# Patient Record
Sex: Male | Born: 1985
Health system: Southern US, Community
[De-identification: ages and names within clinical notes are randomized; demographics above are authoritative.]

## PROBLEM LIST (undated history)

## (undated) DIAGNOSIS — E119 Type 2 diabetes mellitus without complications: Secondary | ICD-10-CM

## (undated) HISTORY — DX: Type 2 diabetes mellitus without complications: E11.9

## (undated) HISTORY — PX: NOSE SURGERY: SHX723

---

## 2010-04-05 ENCOUNTER — Ambulatory Visit: Payer: Self-pay | Admitting: Internal Medicine

## 2010-04-06 ENCOUNTER — Ambulatory Visit: Payer: Self-pay | Admitting: Family Medicine

## 2012-06-13 ENCOUNTER — Ambulatory Visit: Payer: Self-pay

## 2013-08-25 ENCOUNTER — Encounter (HOSPITAL_COMMUNITY): Payer: Self-pay | Admitting: Emergency Medicine

## 2013-08-25 ENCOUNTER — Emergency Department (INDEPENDENT_AMBULATORY_CARE_PROVIDER_SITE_OTHER)
Admission: EM | Admit: 2013-08-25 | Discharge: 2013-08-25 | Disposition: A | Discharge status: Home / Self Care | Payer: Worker's Compensation | Source: Home / Self Care | Attending: Family Medicine | Admitting: Family Medicine

## 2013-08-25 DIAGNOSIS — S61209A Unspecified open wound of unspecified finger without damage to nail, initial encounter: Secondary | ICD-10-CM

## 2013-08-25 DIAGNOSIS — S61219A Laceration without foreign body of unspecified finger without damage to nail, initial encounter: Secondary | ICD-10-CM

## 2013-08-25 MED ORDER — CEPHALEXIN 500 MG PO CAPS: 500.0000 mg | ORAL_CAPSULE | Freq: Four times a day (QID) | ORAL | Status: DC

## 2013-08-25 NOTE — ED Notes (Signed)
Tetanus 2 years ago 

## 2013-08-25 NOTE — ED Notes (Signed)
Cut finger washing dishes, laceration across right index finger, knuckle .  Bleeding controlled currently.

## 2013-08-25 NOTE — ED Notes (Signed)
Soaking finger.

## 2013-08-25 NOTE — Discharge Instructions (Signed)
Thank you for coming in today.' Keep the wound clean and covered with ointment Get the stitches taken out in one week Take Keflex 4 times daily for one week

## 2013-08-25 NOTE — ED Provider Notes (Signed)
Trevor Dalton is a 28 y.o. male who presents to Urgent Care today for right finger laceration. This occurred this evening at work. Patient was cleaning a knife. He denies any significant numbness or weakness. He has no problems with extension. His last tetanus was a few years ago. He feels well. His only medical problem is type 1 diabetes controlled with insulin.    Past Medical History  Diagnosis Date  . Diabetes mellitus without complication    History  Substance Use Topics  . Smoking status: Never Smoker   . Smokeless tobacco: Not on file  . Alcohol Use: Yes   ROS as above Medications: No current facility-administered medications for this encounter.   Current Outpatient Prescriptions  Medication Sig Dispense Refill  . insulin aspart (NOVOLOG) 100 UNIT/ML injection Inject into the skin 3 (three) times daily before meals.      . insulin glargine (LANTUS) 100 UNIT/ML injection Inject into the skin at bedtime.      . cephALEXin (KEFLEX) 500 MG capsule Take 1 capsule (500 mg total) by mouth 4 (four) times daily.  28 capsule  0    Exam:  BP 136/97  Pulse 92  Temp(Src) 97.8 F (36.6 C) (Oral)  Resp 20  SpO2 98% Gen: Well NAD SKIN: 1 cm laceration along the radial side of the right index finger just distal to the PIP. No tendon seen with range of motion. Sensation is very slightly decreased on the distal radial end of the finger. Pulses and capillary refill are intact. Strength is intact to flexion and extension.   Laceration repair:  Consent obtained and timeout performed Skin cleaned with Betadine. 4 mL of lidocaine was injected to achieve a good digital block. The wound was copiously irrigated with sterile saline The skin was prepped and draped in the usual sterile fashion One horizontal mattress suture using 4-0 Prolene was used to close the wound Wound well with range of motion. The Betadine was cleaned off of the skin and a sterile dressing was applied.   Assessment  and Plan: 28 y.o. male with right finger laceration. Will place patient on apparent Keflex antibiotics as he is relatively immunocompromised due to his diabetes. Tetanus is up-to-date. Followup in one week for suture removal.  Discussed warning signs or symptoms. Please see discharge instructions. Patient expresses understanding.    Trevor BongEvan S Mechel Schutter, MD 08/25/13 2003

## 2013-09-10 ENCOUNTER — Encounter: Payer: Self-pay | Attending: Endocrinology | Admitting: Nutrition

## 2013-09-10 ENCOUNTER — Encounter: Payer: Self-pay | Admitting: Endocrinology

## 2013-09-10 ENCOUNTER — Ambulatory Visit (INDEPENDENT_AMBULATORY_CARE_PROVIDER_SITE_OTHER): Payer: Self-pay | Admitting: Endocrinology

## 2013-09-10 VITALS — BP 132/90 | HR 93 | Temp 98.5°F | Ht 73.0 in | Wt 213.0 lb

## 2013-09-10 DIAGNOSIS — E109 Type 1 diabetes mellitus without complications: Secondary | ICD-10-CM | POA: Insufficient documentation

## 2013-09-10 MED ORDER — INSULIN GLARGINE 100 UNIT/ML SOLOSTAR PEN: 25.0000 [IU] | PEN_INJECTOR | Freq: Two times a day (BID) | SUBCUTANEOUS | Status: DC

## 2013-09-10 MED ORDER — INSULIN ASPART 100 UNIT/ML FLEXPEN: PEN_INJECTOR | SUBCUTANEOUS | Status: DC

## 2013-09-10 NOTE — Progress Notes (Signed)
   Subjective:    Patient ID: Trevor Dalton, Trevor Dalton    DOB: 12/15/1985, 28 y.o.   MRN: [id number]  HPI pt states DM was dx'ed in 1992; he has mild if any neuropathy of the lower extremities; he is unaware of any associated chronic complications.  he has been on insulin since dx.  He was on pump rx from 2006-2011.  pt says his diet and exercise are good.  He ran out of lantus 1 month ago.  Since then, cbg's have been elevated.  He has never had DKA; he has had only 1 episode of severe hypoglycemia, in 2005.  Pt says his insurance changes are compromising his ability to get insulin. Past Medical History  Diagnosis Date  . Diabetes mellitus without complication     No past surgical history on file.  History   Social History  . Marital Status: Single    Spouse Name: N/A    Number of Children: N/A  . Years of Education: N/A   Occupational History  . Not on file.   Social History Main Topics  . Smoking status: Never Smoker   . Smokeless tobacco: Not on file  . Alcohol Use: Yes  . Drug Use: Yes    Special: Marijuana  . Sexual Activity: Not on file   Other Topics Concern  . Not on file   Social History Narrative  . No narrative on file   No current outpatient prescriptions on file prior to visit.   No current facility-administered medications on file prior to visit.   No Known Allergies  No family history on file.  BP 132/90  Pulse 93  Temp(Src) 98.5 F (36.9 C) (Oral)  Ht 6\' 1"  (1.854 m)  Wt 213 lb (96.616 kg)  BMI 28.11 kg/m2  SpO2 97%  Review of Systems denies weight loss, blurry vision, headache, chest pain, sob, n/v, cramps, excessive diaphoresis, memory loss, depression, cold intolerance, rhinorrhea, and easy bruising. He has urinary frequency.    Objective:   Physical Exam VS: see vs page GEN: no distress HEAD: head: no deformity eyes: no periorbital swelling, no proptosis external nose and ears are normal mouth: no lesion seen NECK: supple, thyroid is  not enlarged CHEST WALL: no deformity LUNGS: clear to auscultation BREASTS:  No gynecomastia CV: reg rate and rhythm, no murmur ABD: abdomen is soft, nontender.  no hepatosplenomegaly.  not distended.  no hernia MUSCULOSKELETAL: muscle bulk and strength are grossly normal.  no obvious joint swelling.  gait is normal and steady PULSES:  no carotid bruit NEURO:  cn 2-12 grossly intact.   readily moves all 4's.   SKIN:  Normal texture and temperature.  No rash or suspicious lesion is visible. Insulin injection sites at the anterior abdomen have hypertrophic sq fat. NODES:  None palpable at the neck PSYCH: alert, well-oriented.  Does not appear anxious nor depressed.   outside test results are reviewed: A1c=10.2    Assessment & Plan:  Type 1 DM: poor control Hypertrophy of sq fat, due to insulin injections.  Pt is advised to avoid these areas. Economic circumstances: these compromise the care of his DM.

## 2013-09-10 NOTE — Patient Instructions (Addendum)
good diet and exercise habits significanly improve the control of your diabetes.  please let me know if you wish to be referred to a dietician.  high blood sugar is very risky to your health.  you should see an eye doctor every year.  You are at higher than average risk for pneumonia and hepatitis-B.  You should be vaccinated against both.   controlling your blood pressure and cholesterol drastically reduces the damage diabetes does to your body.  this also applies to quitting smoking.  please discuss these with your doctor.  check your blood sugar 4 times a day: before the 3 meals, and at bedtime.  also check if you have symptoms of your blood sugar being too high or too low.  please keep a record of the readings and bring it to your next appointment here.  You can write it on any piece of paper.  please call us sooner if your blood sugar goes below 70, or if you have a lot of readings over 200.   Please continue the same lantus, and: Novolog: 1 unit for each 15 grams carbohydate, and 1 unit for each 50 over 150.   Please come back for a follow-up appointment in 3 months.  Please see Cristy FolksLinda Spagnola, about resuming pump therapy.

## 2013-12-11 ENCOUNTER — Ambulatory Visit: Payer: Self-pay | Admitting: Endocrinology

## 2013-12-11 DIAGNOSIS — Z0289 Encounter for other administrative examinations: Secondary | ICD-10-CM

## 2014-10-16 ENCOUNTER — Other Ambulatory Visit: Payer: Self-pay | Admitting: Endocrinology

## 2014-10-16 NOTE — Telephone Encounter (Signed)
Please advise if ok to refill rx. Pt was last seen in March of 2015.  Thanks!

## 2014-10-16 NOTE — Telephone Encounter (Signed)
Rx sent per pt's request.  

## 2014-10-16 NOTE — Telephone Encounter (Signed)
Please refill x 1 Ov is due  

## 2014-12-04 ENCOUNTER — Encounter: Payer: Self-pay | Admitting: Endocrinology

## 2014-12-04 ENCOUNTER — Ambulatory Visit: Payer: 59 | Admitting: Endocrinology

## 2014-12-04 ENCOUNTER — Telehealth: Payer: Self-pay

## 2014-12-04 VITALS — BP 132/80 | HR 100 | Temp 98.2°F | Ht 73.0 in | Wt 226.0 lb

## 2014-12-04 DIAGNOSIS — E109 Type 1 diabetes mellitus without complications: Secondary | ICD-10-CM

## 2014-12-04 LAB — LIPID PANEL
CHOL/HDL RATIO: 3
Cholesterol: 212 mg/dL — ABNORMAL HIGH (ref 0–200)
HDL: 65.6 mg/dL (ref >39.00)
LDL Cholesterol: 121 mg/dL — ABNORMAL HIGH (ref 0–99)
NONHDL: 146.4
Triglycerides: 126 mg/dL (ref 0.0–149.0)
VLDL: 25.2 mg/dL (ref 0.0–40.0)

## 2014-12-04 LAB — BASIC METABOLIC PANEL
BUN: 11 mg/dL (ref 6–23)
CALCIUM: 8.9 mg/dL (ref 8.4–10.5)
CHLORIDE: 99 meq/L (ref 96–112)
CO2: 25 mEq/L (ref 19–32)
Creatinine, Ser: 0.81 mg/dL (ref 0.40–1.50)
GFR: 121.43 mL/min (ref >60.00)
GLUCOSE: 297 mg/dL — AB (ref 70–99)
Potassium: 4.4 mEq/L (ref 3.5–5.1)
SODIUM: 132 meq/L — AB (ref 135–145)

## 2014-12-04 LAB — HEMOGLOBIN A1C: Hgb A1c MFr Bld: 9 % — ABNORMAL HIGH (ref 4.6–6.5)

## 2014-12-04 LAB — TSH: TSH: 0.97 u[IU]/mL (ref 0.35–4.50)

## 2014-12-04 MED ORDER — INSULIN GLARGINE 100 UNIT/ML SOLOSTAR PEN: PEN_INJECTOR | SUBCUTANEOUS | Status: DC

## 2014-12-04 MED ORDER — INSULIN PEN NEEDLE 31G X 5 MM MISC: Status: DC

## 2014-12-04 MED ORDER — INSULIN ASPART 100 UNIT/ML FLEXPEN: PEN_INJECTOR | SUBCUTANEOUS | Status: DC

## 2014-12-04 MED ORDER — INSULIN LISPRO 100 UNIT/ML (KWIKPEN)
60.0000 [IU] | PEN_INJECTOR | Freq: Every day | SUBCUTANEOUS | Status: DC
Start: 2014-12-04 — End: 2015-07-27

## 2014-12-04 MED ORDER — GLUCOSE BLOOD VI STRP: 1.0000 | ORAL_STRIP | Freq: Four times a day (QID) | Status: DC

## 2014-12-04 NOTE — Patient Instructions (Addendum)
check your blood sugar 4 times a day: before the 3 meals, and at bedtime.  also check if you have symptoms of your blood sugar being too high or too low.  please keep a record of the readings and bring it to your next appointment here.  You can write it on any piece of paper.  please call us sooner if your blood sugar goes below 70, or if you have a lot of readings over 200.   blood tests are requested for you today.  We'll let you know about the results. Please continue the same lantus (i have sent a prescription to your pharmacy), and: Novolog: 1 unit for each 15 grams carbohydate, and 1 unit for each 50 over 150.   Please come back for a follow-up appointment in 3 months.  Please let me know if you want to get another pump.

## 2014-12-04 NOTE — Telephone Encounter (Signed)
[facility] stating Novolog is not covered under his insurance. Humalog is the covered alternative. Please advise if ok to change. Thanks!

## 2014-12-04 NOTE — Progress Notes (Signed)
Subjective:    Patient ID: Trevor Dalton, male    DOB: 10/27/1985, 29 y.o.   MRN: [id number]  HPI  Pt returns for f/u of diabetes mellitus: DM type: Insulin-requiring type 2 Dx'ed: 1992 Complications: none Therapy: insulin since dx DKA: never Severe hypoglycemia: once, in 2005 Pancreatitis: never Other: he needs a simple insulin regimen, due to h/o noncompliance Interval history: He has regained his health insurance.  He has been out of lantus, but he takes extra novolog.  When he is on the novolog, he averages 20-40 units total per day.  pt states he feels well in general. Past Medical History  Diagnosis Date  . Diabetes mellitus without complication     No past surgical history on file.  History   Social History  . Marital Status: Single    Spouse Name: N/A  . Number of Children: N/A  . Years of Education: N/A   Occupational History  . Not on file.   Social History Main Topics  . Smoking status: Never Smoker   . Smokeless tobacco: Not on file  . Alcohol Use: Yes  . Drug Use: Yes    Special: Marijuana  . Sexual Activity: Not on file   Other Topics Concern  . Not on file   Social History Narrative    No current outpatient prescriptions on file prior to visit.   No current facility-administered medications on file prior to visit.    No Known Allergies  No family history on file.  BP 132/80 mmHg  Pulse 100  Temp(Src) 98.2 F (36.8 C) (Oral)  Ht 6\' 1"  (1.854 m)  Wt 226 lb (102.513 kg)  BMI 29.82 kg/m2  SpO2 92%   Review of Systems He denies hypoglycemia.     Objective:   Physical Exam VITAL SIGNS:  See vs page GENERAL: no distress Pulses: dorsalis pedis intact bilat.   MSK: no deformity of the feet.   CV: no leg edema. Skin:  no ulcer on the feet.  normal color and temp on the feet. Neuro: sensation is intact to touch on the feet.  Lab Results  Component Value Date   GLUCOSE 297* 12/04/2014   CHOL 212* 12/04/2014   TRIG 126.0  12/04/2014   HDL 65.60 12/04/2014   LDLCALC 121* 12/04/2014   NA 132* 12/04/2014   K 4.4 12/04/2014   CL 99 12/04/2014   CREATININE 0.81 12/04/2014   BUN 11 12/04/2014   CO2 25 12/04/2014   TSH 0.97 12/04/2014   HGBA1C 9.0* 12/04/2014       Assessment & Plan:  DM: he needs increased rx Dyslipidemia: new: i advised medication  Patient is advised the following: Patient Instructions  check your blood sugar 4 times a day: before the 3 meals, and at bedtime.  also check if you have symptoms of your blood sugar being too high or too low.  please keep a record of the readings and bring it to your next appointment here.  You can write it on any piece of paper.  please call us sooner if your blood sugar goes below 70, or if you have a lot of readings over 200.   blood tests are requested for you today.  We'll let you know about the results. Please continue the same lantus (i have sent a prescription to your pharmacy), and: Novolog: 1 unit for each 15 grams carbohydate, and 1 unit for each 50 over 150.   Please come back for a follow-up appointment in  3 months.  Please let me know if you want to get another pump.

## 2014-12-04 NOTE — Telephone Encounter (Signed)
ok 

## 2014-12-04 NOTE — Telephone Encounter (Signed)
Rx sent to pt's pharmacy. Patient was not available to notify.

## 2014-12-07 ENCOUNTER — Encounter: Payer: Self-pay | Admitting: Endocrinology

## 2014-12-07 LAB — MICROALBUMIN / CREATININE URINE RATIO
CREATININE, U: 143.4 mg/dL
Microalb Creat Ratio: 0.7 mg/g (ref 0.0–30.0)
Microalb, Ur: 1 mg/dL (ref 0.0–1.9)

## 2015-03-09 ENCOUNTER — Ambulatory Visit: Payer: Self-pay | Admitting: Endocrinology

## 2015-07-27 ENCOUNTER — Telehealth: Payer: Self-pay

## 2015-07-27 MED ORDER — INSULIN ASPART 100 UNIT/ML FLEXPEN: 60.0000 [IU] | PEN_INJECTOR | Freq: Every day | SUBCUTANEOUS | Status: DC

## 2015-07-27 NOTE — Telephone Encounter (Signed)
Received notice from the pt's pharmacy stating Humalog is no longer covered under the pt's formulary. Preferred alternative is novolog. Can we switch the medication?

## 2015-07-27 NOTE — Telephone Encounter (Signed)
Ok. Please refill x 1 Ov is due  

## 2015-07-27 NOTE — Telephone Encounter (Signed)
Rx sent. Attempted to reach the pt. Pt unavailable, appointment letter mailed to the pt.

## 2016-02-24 ENCOUNTER — Encounter: Payer: Self-pay | Admitting: Endocrinology

## 2016-02-24 ENCOUNTER — Other Ambulatory Visit: Payer: Self-pay | Admitting: Endocrinology

## 2016-02-24 MED ORDER — GLUCOSE BLOOD VI STRP: 1.0000 | ORAL_STRIP | Freq: Four times a day (QID) | 0 refills | Status: DC

## 2016-02-24 MED ORDER — INSULIN ASPART 100 UNIT/ML FLEXPEN: 60.0000 [IU] | PEN_INJECTOR | Freq: Every day | SUBCUTANEOUS | 0 refills | Status: DC

## 2016-02-24 MED ORDER — INSULIN GLARGINE 100 UNIT/ML SOLOSTAR PEN: PEN_INJECTOR | SUBCUTANEOUS | 0 refills | Status: DC

## 2016-05-10 ENCOUNTER — Other Ambulatory Visit: Payer: Self-pay | Admitting: Endocrinology

## 2016-05-10 MED ORDER — INSULIN GLARGINE 100 UNIT/ML SOLOSTAR PEN: PEN_INJECTOR | SUBCUTANEOUS | 0 refills | Status: DC

## 2016-05-10 MED ORDER — INSULIN ASPART 100 UNIT/ML FLEXPEN: 60.0000 [IU] | PEN_INJECTOR | Freq: Every day | SUBCUTANEOUS | 0 refills | Status: DC

## 2016-05-10 NOTE — Telephone Encounter (Signed)
Please refill x 1 Ov is due  

## 2016-05-10 NOTE — Telephone Encounter (Signed)
Pt called and requests refill of his insulin and pen needles to be sent to [facility].  He stated the pen needles can be the smallest size. Appt scheduled for Monday

## 2016-05-14 NOTE — Progress Notes (Signed)
Subjective:    Patient ID: Trevor Dalton, male    DOB: 04/04/1986, 30 y.o.   MRN: [id number]  HPI Pt returns for f/u of diabetes mellitus: DM type: Insulin-requiring type 2 Dx'ed: 1992 Complications: none Therapy: insulin since dx DKA: never Severe hypoglycemia: once, in 2005.   Pancreatitis: never.   Other: he takes multiple daily injections Interval history: He has regained his health insurance once again.  He is back on both insulins.  He averages a total of 30-50 total units of novolog, per day.  pt states he feels well in general.  He seldom misses the lantus, but does not miss the novolog.  He has mild hypoglycemia approx twice a week, and these episodes are mild.  no cbg record, but states cbg's vary from 90-200.  There is no trend throughout the day.  He declines pump, but he wants a continuous glucose monitor.   Past Medical History:  Diagnosis Date  . Diabetes mellitus without complication (HCC)     No past surgical history on file.  Social History   Social History  . Marital status: Single    Spouse name: N/A  . Number of children: N/A  . Years of education: N/A   Occupational History  . Not on file.   Social History Main Topics  . Smoking status: Never Smoker  . Smokeless tobacco: Not on file  . Alcohol use Yes  . Drug use:     Types: Marijuana  . Sexual activity: Not on file   Other Topics Concern  . Not on file   Social History Narrative  . No narrative on file    Current Outpatient Prescriptions on File Prior to Visit  Medication Sig Dispense Refill  . glucose blood (ONETOUCH VERIO) test strip 1 each by Other route 4 (four) times daily. And lancets 4/day 120 each 0  . insulin aspart (NOVOLOG FLEXPEN) 100 UNIT/ML FlexPen Inject 60 Units into the skin daily. (Patient taking differently: Inject 60 Units into the skin daily. 1 unit to every 15 grams of carbs) 30 mL 0  . Insulin Glargine (LANTUS SOLOSTAR) 100 UNIT/ML Solostar Pen Inject 25 units into  the skin two times daily. (Patient taking differently: Inject 30 units into the skin two times daily.) 15 mL 0   No current facility-administered medications on file prior to visit.     No Known Allergies  No family history on file.  BP 138/86   Pulse 82   Ht 6\' 1"  (1.854 m)   Wt 218 lb (98.9 kg)   SpO2 98%   BMI 28.76 kg/m    Review of Systems He denies hypoglycemia.      Objective:   Physical Exam VITAL SIGNS:  See vs page GENERAL: no distress Pulses: dorsalis pedis intact bilat.   MSK: no deformity of the feet CV: no leg edema Skin:  no ulcer on the feet.  normal color and temp on the feet. Neuro: sensation is intact to touch on the feet.    A1c=8.2%.     Assessment & Plan:  Type 1 DM: he needs increased rx.  The next step is continuous glucose monitor.    Patient is advised the following: Patient Instructions  check your blood sugar 4 times a day: before the 3 meals, and at bedtime.  also check if you have symptoms of your blood sugar being too high or too low.  please keep a record of the readings and bring it to your next  appointment here.  You can write it on any piece of paper.  please call us sooner if your blood sugar goes below 70, or if you have a lot of readings over 200.   Please continue the same lantus (I have sent a prescription to your pharmacy), and:  Novolog: 1 unit for each 15 grams carbohydate, and 1 unit for each 50 by which your blood sugar exceeds 150.   Please see Bonita QuinLinda, about a continuous glucose monitor.   Please come back for a follow-up appointment in 2 months.

## 2016-05-15 ENCOUNTER — Encounter: Payer: Self-pay | Admitting: Endocrinology

## 2016-05-15 ENCOUNTER — Ambulatory Visit (INDEPENDENT_AMBULATORY_CARE_PROVIDER_SITE_OTHER): Payer: BLUE CROSS/BLUE SHIELD | Admitting: Endocrinology

## 2016-05-15 VITALS — BP 138/86 | HR 82 | Ht 73.0 in | Wt 218.0 lb

## 2016-05-15 DIAGNOSIS — Z23 Encounter for immunization: Secondary | ICD-10-CM

## 2016-05-15 DIAGNOSIS — E109 Type 1 diabetes mellitus without complications: Secondary | ICD-10-CM | POA: Diagnosis not present

## 2016-05-15 LAB — POCT GLYCOSYLATED HEMOGLOBIN (HGB A1C): Hemoglobin A1C: 8.2

## 2016-05-15 LAB — MICROALBUMIN / CREATININE URINE RATIO
Creatinine,U: 98.9 mg/dL
Microalb Creat Ratio: 0.7 mg/g (ref 0.0–30.0)
Microalb, Ur: <0.7 mg/dL (ref 0.0–1.9)

## 2016-05-15 MED ORDER — INSULIN PEN NEEDLE 31G X 5 MM MISC: 2 refills | Status: DC

## 2016-05-15 NOTE — Patient Instructions (Addendum)
check your blood sugar 4 times a day: before the 3 meals, and at bedtime.  also check if you have symptoms of your blood sugar being too high or too low.  please keep a record of the readings and bring it to your next appointment here.  You can write it on any piece of paper.  please call us sooner if your blood sugar goes below 70, or if you have a lot of readings over 200.   Please continue the same lantus (I have sent a prescription to your pharmacy), and:  Novolog: 1 unit for each 15 grams carbohydate, and 1 unit for each 50 by which your blood sugar exceeds 150.   Please see Bonita QuinLinda, about a continuous glucose monitor.   Please come back for a follow-up appointment in 2 months.

## 2016-05-21 ENCOUNTER — Other Ambulatory Visit: Payer: Self-pay | Admitting: Endocrinology

## 2016-07-09 NOTE — Progress Notes (Deleted)
   Subjective:    Patient ID: Trevor Dalton, male    DOB: 03/19/1986, 31 y.o.   MRN: [id number]  HPI Pt returns for f/u of diabetes mellitus: DM type: Insulin-requiring type 2 Dx'ed: 1992 Complications: none Therapy: insulin since dx DKA: never Severe hypoglycemia: once, in 2005.   Pancreatitis: never.   Other: he takes multiple daily injections Interval history: He has regained his health insurance once again.  He is back on both insulins.  He averages a total of 30-50 total units of novolog, per day.  pt states he feels well in general.  He seldom misses the lantus, but does not miss the novolog.  He has mild hypoglycemia approx twice a week, and these episodes are mild.  no cbg record, but states cbg's vary from 90-200.  There is no trend throughout the day.  He declines pump, but he wants a continuous glucose monitor.    Review of Systems     Objective:   Physical Exam VITAL SIGNS:  See vs page GENERAL: no distress Pulses: dorsalis pedis intact bilat.   MSK: no deformity of the feet CV: no leg edema Skin:  no ulcer on the feet.  normal color and temp on the feet. Neuro: sensation is intact to touch on the feet        Assessment & Plan:

## 2016-07-14 ENCOUNTER — Ambulatory Visit (INDEPENDENT_AMBULATORY_CARE_PROVIDER_SITE_OTHER): Payer: Self-pay | Admitting: Endocrinology

## 2016-07-14 DIAGNOSIS — Z0289 Encounter for other administrative examinations: Secondary | ICD-10-CM

## 2016-07-21 ENCOUNTER — Other Ambulatory Visit: Payer: Self-pay | Admitting: Endocrinology

## 2016-08-14 ENCOUNTER — Other Ambulatory Visit: Payer: Self-pay | Admitting: Endocrinology

## 2016-10-16 ENCOUNTER — Other Ambulatory Visit: Payer: Self-pay | Admitting: Endocrinology

## 2016-11-20 ENCOUNTER — Other Ambulatory Visit: Payer: Self-pay | Admitting: Endocrinology

## 2016-11-27 ENCOUNTER — Encounter: Payer: Self-pay | Admitting: Emergency Medicine

## 2016-11-27 ENCOUNTER — Ambulatory Visit (INDEPENDENT_AMBULATORY_CARE_PROVIDER_SITE_OTHER): Payer: BLUE CROSS/BLUE SHIELD

## 2016-11-27 ENCOUNTER — Ambulatory Visit
Admission: EM | Admit: 2016-11-27 | Discharge: 2016-11-27 | Disposition: A | Discharge status: Home / Self Care | Payer: BLUE CROSS/BLUE SHIELD | Attending: Family Medicine | Admitting: Family Medicine

## 2016-11-27 DIAGNOSIS — M25561 Pain in right knee: Secondary | ICD-10-CM | POA: Diagnosis not present

## 2016-11-27 DIAGNOSIS — M25461 Effusion, right knee: Secondary | ICD-10-CM | POA: Diagnosis not present

## 2016-11-27 MED ORDER — MELOXICAM 15 MG PO TABS: 15.0000 mg | ORAL_TABLET | Freq: Every day | ORAL | 0 refills | Status: AC | PRN

## 2016-11-27 MED ORDER — TRAMADOL HCL 50 MG PO TABS: 50.0000 mg | ORAL_TABLET | Freq: Three times a day (TID) | ORAL | 0 refills | Status: DC | PRN

## 2016-11-27 NOTE — ED Provider Notes (Signed)
[facility] ____________________________________________  Time seen: Approximately 4:42 PM  I have reviewed the triage vital signs and the nursing notes.   HISTORY  Chief Complaint Knee Injury  HPI Trevor Dalton is a 31 y.o. male  for evaluation of right knee pain and swelling. Patient reports that last night he was playing basketball. Patient states that he's step down on his right foot firmly and then twisted causing immediate onset of right knee pain. Reports right knee did give out and he fell. Denies any other pain or injury. Denies head injury, loss of consciousness. Reports having difficulty weightbearing on right knee. States moderate right knee pain at this time worse with direct palpation or ambulation. Patient states that he feels like his knee is going to give out when he places weight on the knee. Reports he has had continued swelling to right knee since injury. Denies any new complaints prior injury. Reports has taken over-the-counter ibuprofen and applied ice and elevated. Denies any other injuries. Reports otherwise feels well. Denies any other alleviating factors taken.  Denies chest pain, shortness of breath, abdominal pain,  or rash. Denies recent sickness. Denies recent antibiotic use.  Reports type one diabetic and reports diabetes has been well controlled. Denies any recent medication or changes to medical history.   History reviewed. No pertinent past medical history.  Patient Active Problem List   Diagnosis Date Noted  . Type 1 diabetes mellitus (HCC) 09/10/2013    Past Surgical History:  Procedure Laterality Date  . NOSE SURGERY       No current facility-administered medications for this encounter.   Current Outpatient Prescriptions:  .  BAYER CONTOUR NEXT TEST test strip, USE AS DIRECTED TO TEST BLOOD SUGAR FOUR TIMES DAILY, Disp: 120 each, Rfl: 11 .  Insulin Pen Needle 31G X 5 MM MISC, Use to inject insulin 4 times per day, Disp: 100  each, Rfl: 2 .  LANTUS SOLOSTAR 100 UNIT/ML Solostar Pen, INJECT 25 UNITS UNDER THE SKIN TWICE DAILY( NEEDS APPOINTMENT), Disp: 15 mL, Rfl: 0 .  NOVOLOG FLEXPEN 100 UNIT/ML FlexPen, INJECT 60 UNITS UNDER THE SKIN DAILY, Disp: 30 mL, Rfl: 0 .  meloxicam (MOBIC) 15 MG tablet, Take 1 tablet (15 mg total) by mouth daily as needed., Disp: 10 tablet, Rfl: 0 .  traMADol (ULTRAM) 50 MG tablet, Take 1 tablet (50 mg total) by mouth every 8 (eight) hours as needed (Do not drive or operate machinery while taking as can cause drowsiness.)., Disp: 12 tablet, Rfl: 0  Allergies Patient has no known allergies.  History reviewed. No pertinent family history.  Social History Social History  Substance Use Topics  . Smoking status: Former Games developer  . Smokeless tobacco: Never Used  . Alcohol use Yes    Review of Systems Constitutional: No fever/chills Cardiovascular: Denies chest pain. Respiratory: Denies shortness of breath. Gastrointestinal: No abdominal pain.  As above. Musculoskeletal: Negative for back pain. Skin: Negative for rash. Denies insect bite.  ____________________________________________   PHYSICAL EXAM:  VITAL SIGNS: ED Triage Vitals  Enc Vitals Group     BP 11/27/16 1432 136/76     Pulse Rate 11/27/16 1432 91     Resp 11/27/16 1432 18     Temp 11/27/16 1432 98.4 F (36.9 C)     Temp Source 11/27/16 1432 Oral     SpO2 11/27/16 1432 100 %     Weight 11/27/16 1435 221 lb (100.2 kg)     Height 11/27/16 1435 6\' 2"  (1.88  m)     Head Circumference --      Peak Flow --      Pain Score 11/27/16 1435 8     Pain Loc --      Pain Edu? --      Excl. in GC? --     Constitutional: Alert and oriented. Well appearing and in no acute distress. Cardiovascular: Normal rate, regular rhythm. Grossly normal heart sounds.  Good peripheral circulation. Respiratory: Normal respiratory effort without tachypnea nor retractions. Breath sounds are clear and equal bilaterally. No wheezes, rales,  rhonchi. Musculoskeletal: No midline cervical, thoracic or lumbar tenderness to palpation. Bilateral pedal pulses equal and easily palpated.Ambulatory with antalgic and unsteady gait. Right knee with diffuse edema, more so to right medial knee, right knee diffusely tender with increased tenderness along medial to posterior, full range of motion present, mild pain with anterior and posterior drawer test, minimal pain with medial or lateral stress, no pain with knee extension and flexion, right lower extremity otherwise nontender. Neurologic:  Normal speech and language.  Speech is normal. No gait instability.  Skin:  Skin is warm, dry  Psychiatric: Mood and affect are normal. Speech and behavior are normal. Patient exhibits appropriate insight and judgment   ___________________________________________   LABS (all labs ordered are listed, but only abnormal results are displayed)  Labs Reviewed - No data to display ____________________________________________  RADIOLOGY  Dg Knee Complete 4 Views Right  Result Date: 11/27/2016 CLINICAL DATA:  Knee gave out on hand yesterday while playing basketball, having medial, lateral and posterior pain with diffuse swelling EXAM: RIGHT KNEE - COMPLETE 4+ VIEW COMPARISON:  None FINDINGS: Osseous mineralization probably normal for technique. Joint spaces preserved. No acute fracture, dislocation, or bone destruction. Small knee joint effusion. IMPRESSION: Small knee joint effusion. No acute osseous abnormalities. Electronically Signed   By: Ulyses Southward M.D.   On: 11/27/2016 16:07   ____________________________________________   PROCEDURES Procedures   Crutches and knee immobilizer given. INITIAL IMPRESSION / ASSESSMENT AND PLAN / ED COURSE  Pertinent labs & imaging results that were available during my care of the patient were reviewed by me and considered in my medical decision making (see chart for details).  Well-appearing patient. No acute  distress. Present for right knee pain post mechanical injury. Right knee x-ray per radiologist small knee joint effusion, no acute osseous abnormalities. Discussed in detail with patient concern for ligamentous injury. Right knee immobilizer and crutches given. Encouraged rest, ice, elevation, will treat with oral daily Mobic and when necessary tramadol as needed for breakthrough pain. Encourage orthopedic follow-up this week.Discussed indication, risks and benefits of medications with patient.   [place] controlled substance database reviewed for last one year, and no recent controlled substances documented.  Discussed follow up with Primary care physician this week. Discussed follow up and return parameters including no resolution or any worsening concerns. Patient verbalized understanding and agreed to plan.   ____________________________________________   FINAL CLINICAL IMPRESSION(S) / ED DIAGNOSES  Final diagnoses:  Acute pain of right knee     Discharge Medication List as of 11/27/2016  4:13 PM    START taking these medications   Details  meloxicam (MOBIC) 15 MG tablet Take 1 tablet (15 mg total) by mouth daily as needed., Starting Mon 11/27/2016, Normal    traMADol (ULTRAM) 50 MG tablet Take 1 tablet (50 mg total) by mouth every 8 (eight) hours as needed (Do not drive or operate machinery while taking as can cause drowsiness.).,  Starting Mon 11/27/2016, Print        Note: This dictation was prepared with Dragon dictation along with smaller phrase technology. Any transcriptional errors that result from this process are unintentional.         Renford DillsMiller, Lorinda Copland, NP 11/27/16 1655

## 2016-11-27 NOTE — Discharge Instructions (Signed)
Take medication as prescribed. Rest. Elevate and apply ice. Use knee immobilizer. Use crutches. Gradually apply weight as tolerated. Follow-up with orthopedic this week for continued pain.  Follow up with your primary care physician this week as needed. Return to Urgent care for new or worsening concerns.

## 2016-11-27 NOTE — ED Triage Notes (Signed)
Patient states he was playing basketball yesterday and his right knee "gave out", today is very swollen and painful

## 2016-11-30 DIAGNOSIS — M2391 Unspecified internal derangement of right knee: Secondary | ICD-10-CM | POA: Diagnosis not present

## 2016-11-30 DIAGNOSIS — M25461 Effusion, right knee: Secondary | ICD-10-CM | POA: Diagnosis not present

## 2016-12-04 DIAGNOSIS — M25461 Effusion, right knee: Secondary | ICD-10-CM | POA: Diagnosis not present

## 2016-12-06 DIAGNOSIS — S82101A Unspecified fracture of upper end of right tibia, initial encounter for closed fracture: Secondary | ICD-10-CM | POA: Diagnosis not present

## 2016-12-27 DIAGNOSIS — S82101A Unspecified fracture of upper end of right tibia, initial encounter for closed fracture: Secondary | ICD-10-CM | POA: Diagnosis not present

## 2016-12-29 ENCOUNTER — Encounter: Payer: Self-pay | Admitting: Endocrinology

## 2016-12-29 ENCOUNTER — Other Ambulatory Visit: Payer: Self-pay | Admitting: Endocrinology

## 2016-12-30 ENCOUNTER — Other Ambulatory Visit: Payer: Self-pay | Admitting: Endocrinology

## 2016-12-31 NOTE — Telephone Encounter (Signed)
Please refill x 2 mos Ov is due 

## 2017-01-01 ENCOUNTER — Other Ambulatory Visit: Payer: Self-pay

## 2017-01-01 MED ORDER — INSULIN GLARGINE 100 UNIT/ML SOLOSTAR PEN: PEN_INJECTOR | SUBCUTANEOUS | 0 refills | Status: DC

## 2017-01-01 MED ORDER — INSULIN ASPART 100 UNIT/ML FLEXPEN: PEN_INJECTOR | SUBCUTANEOUS | 0 refills | Status: DC

## 2017-01-01 NOTE — Telephone Encounter (Signed)
Advised patient he has an appointment scheduled already on July 26th 2018 1:15pm.  **Remind patient they can make refill requests via MyChart**  Medication refill request (Name & Dosage): (NOVOLOG FLEXPEN) 100 UNIT/ML FlexPen   Insulin Glargine (LANTUS SOLOSTAR) 100 UNIT/ML Solostar Pen   Preferred pharmacy (Name & Address):  [facility] [place]  Other comments (if applicable):  Patient injured leg and is not mobile.

## 2017-01-01 NOTE — Telephone Encounter (Signed)
Refill for Novolog and Lantus submitted to the [facility] in [place] per patient's request.

## 2017-01-25 ENCOUNTER — Telehealth: Payer: Self-pay | Admitting: Endocrinology

## 2017-01-25 ENCOUNTER — Ambulatory Visit (INDEPENDENT_AMBULATORY_CARE_PROVIDER_SITE_OTHER): Payer: BLUE CROSS/BLUE SHIELD | Admitting: Endocrinology

## 2017-01-25 VITALS — BP 128/76 | HR 98 | Wt 226.6 lb

## 2017-01-25 DIAGNOSIS — E109 Type 1 diabetes mellitus without complications: Secondary | ICD-10-CM

## 2017-01-25 LAB — POCT GLYCOSYLATED HEMOGLOBIN (HGB A1C): Hemoglobin A1C: 7.9

## 2017-01-25 MED ORDER — INSULIN GLARGINE 100 UNIT/ML SOLOSTAR PEN: 30.0000 [IU] | PEN_INJECTOR | Freq: Two times a day (BID) | SUBCUTANEOUS | 11 refills | Status: DC

## 2017-01-25 MED ORDER — INSULIN ASPART 100 UNIT/ML FLEXPEN: PEN_INJECTOR | SUBCUTANEOUS | 0 refills | Status: DC

## 2017-01-25 NOTE — Progress Notes (Signed)
Subjective:    Patient ID: Trevor Dalton, male    DOB: 03/19/1986, 31 y.o.   MRN: [id number]  HPI Pt returns for f/u of diabetes mellitus: DM type: Insulin-requiring type 2 Dx'ed: 1992 Complications: none Therapy: insulin since dx DKA: never Severe hypoglycemia: once, in 2005.   Pancreatitis: never.   Other: he takes multiple daily injections.   Interval history: He averages a total of 30-40 total units of novolog, per day.  pt states he feels well in general.  He seldom misses the lantus, but does not miss the novolog.  He seldom has hypoglycemia, and these episodes are mild.  no cbg record, but states cbg's vary from 130-220.  There is no trend throughout the day.  He now wants to pursue a pump and continuous glucose monitor.  He takes lantus 30-BID.  No past medical history on file.  Past Surgical History:  Procedure Laterality Date  . NOSE SURGERY      Social History   Social History  . Marital status: Single    Spouse name: N/A  . Number of children: N/A  . Years of education: N/A   Occupational History  . Not on file.   Social History Main Topics  . Smoking status: Former Games developermoker  . Smokeless tobacco: Never Used  . Alcohol use Yes  . Drug use: Yes    Types: Marijuana  . Sexual activity: Not on file   Other Topics Concern  . Not on file   Social History Narrative  . No narrative on file    Current Outpatient Prescriptions on File Prior to Visit  Medication Sig Dispense Refill  . BAYER CONTOUR NEXT TEST test strip USE AS DIRECTED TO TEST BLOOD SUGAR FOUR TIMES DAILY 120 each 11  . Insulin Pen Needle 31G X 5 MM MISC Use to inject insulin 4 times per day 100 each 2  . meloxicam (MOBIC) 15 MG tablet Take 1 tablet (15 mg total) by mouth daily as needed. (Patient not taking: Reported on 01/25/2017) 10 tablet 0  . traMADol (ULTRAM) 50 MG tablet Take 1 tablet (50 mg total) by mouth every 8 (eight) hours as needed (Do not drive or operate machinery while taking as  can cause drowsiness.). (Patient not taking: Reported on 01/25/2017) 12 tablet 0   No current facility-administered medications on file prior to visit.     No Known Allergies  No family history on file.  BP 128/76   Pulse 98   Wt 226 lb 9.6 oz (102.8 kg)   SpO2 99%   BMI 29.09 kg/m    Review of Systems Denies LOC    Objective:   Physical Exam VITAL SIGNS:  See vs page GENERAL: no distress Pulses: foot pulses are intact bilaterally.   MSK: no deformity of the feet or ankles.  CV: no edema of the legs or ankles Skin:  no ulcer on the feet or ankles.  normal color and temp on the feet and ankles Neuro: sensation is intact to touch on the feet and ankles.    A1c=7.9%     Assessment & Plan:  Insulin-requiring type 2 DM: he needs increased rx.   Patient Instructions  check your blood sugar 4 times a day: before the 3 meals, and at bedtime.  also check if you have symptoms of your blood sugar being too high or too low.  please keep a record of the readings and bring it to your next appointment here.  You can  write it on any piece of paper.  please call us sooner if your blood sugar goes below 70, or if you have a lot of readings over 200.   Please continue the same lantus (I have sent a prescription to your pharmacy), and:  Novolog: 1 unit for each 13 grams carbohydate, and 1 unit for each 50 by which your blood sugar exceeds 150.   Please see Bonita QuinLinda, about a pump and continuous glucose monitor.   Please come back for a follow-up appointment in 3 months.

## 2017-01-25 NOTE — Telephone Encounter (Signed)
Courtney at [facility] needs clarification on Novolog FlexPen pen needle. Please call back.  Thank you,  -LL

## 2017-01-25 NOTE — Telephone Encounter (Signed)
[facility] pen needle size.

## 2017-01-25 NOTE — Patient Instructions (Addendum)
check your blood sugar 4 times a day: before the 3 meals, and at bedtime.  also check if you have symptoms of your blood sugar being too high or too low.  please keep a record of the readings and bring it to your next appointment here.  You can write it on any piece of paper.  please call us sooner if your blood sugar goes below 70, or if you have a lot of readings over 200.   Please continue the same lantus (I have sent a prescription to your pharmacy), and:  Novolog: 1 unit for each 13 grams carbohydate, and 1 unit for each 50 by which your blood sugar exceeds 150.   Please see Bonita QuinLinda, about a pump and continuous glucose monitor.   Please come back for a follow-up appointment in 3 months.

## 2017-01-26 ENCOUNTER — Other Ambulatory Visit: Payer: Self-pay | Admitting: Endocrinology

## 2017-01-27 ENCOUNTER — Encounter: Payer: Self-pay | Admitting: Endocrinology

## 2017-02-18 ENCOUNTER — Other Ambulatory Visit: Payer: Self-pay | Admitting: Endocrinology

## 2017-02-19 ENCOUNTER — Other Ambulatory Visit: Payer: Self-pay | Admitting: Endocrinology

## 2017-02-20 ENCOUNTER — Other Ambulatory Visit: Payer: Self-pay

## 2017-02-20 MED ORDER — INSULIN ASPART 100 UNIT/ML FLEXPEN: PEN_INJECTOR | SUBCUTANEOUS | 0 refills | Status: DC

## 2017-03-11 ENCOUNTER — Other Ambulatory Visit: Payer: Self-pay | Admitting: Endocrinology

## 2017-03-12 ENCOUNTER — Other Ambulatory Visit: Payer: Self-pay | Admitting: Endocrinology

## 2017-04-12 ENCOUNTER — Other Ambulatory Visit: Payer: Self-pay | Admitting: Endocrinology

## 2017-04-27 ENCOUNTER — Ambulatory Visit: Payer: Self-pay | Admitting: Endocrinology

## 2017-04-27 DIAGNOSIS — Z0289 Encounter for other administrative examinations: Secondary | ICD-10-CM

## 2017-05-23 ENCOUNTER — Other Ambulatory Visit: Payer: Self-pay | Admitting: Endocrinology

## 2017-06-22 ENCOUNTER — Other Ambulatory Visit: Payer: Self-pay | Admitting: Family Medicine

## 2017-06-26 ENCOUNTER — Other Ambulatory Visit: Payer: Self-pay | Admitting: Endocrinology

## 2017-06-26 NOTE — Telephone Encounter (Signed)
Please refill x 1 Ov is due  

## 2017-06-27 ENCOUNTER — Other Ambulatory Visit: Payer: Self-pay

## 2017-06-27 ENCOUNTER — Other Ambulatory Visit: Payer: Self-pay | Admitting: Endocrinology

## 2017-06-27 MED ORDER — GLUCOSE BLOOD VI STRP: ORAL_STRIP | 0 refills | Status: DC

## 2017-06-27 MED ORDER — INSULIN ASPART 100 UNIT/ML FLEXPEN: PEN_INJECTOR | SUBCUTANEOUS | 1 refills | Status: DC

## 2017-06-27 MED ORDER — INSULIN GLARGINE 100 UNIT/ML SOLOSTAR PEN: 30.0000 [IU] | PEN_INJECTOR | Freq: Two times a day (BID) | SUBCUTANEOUS | 1 refills | Status: DC

## 2017-07-31 ENCOUNTER — Encounter: Payer: Self-pay | Admitting: Endocrinology

## 2017-07-31 ENCOUNTER — Ambulatory Visit (INDEPENDENT_AMBULATORY_CARE_PROVIDER_SITE_OTHER): Payer: BLUE CROSS/BLUE SHIELD | Admitting: Endocrinology

## 2017-07-31 ENCOUNTER — Encounter: Payer: BLUE CROSS/BLUE SHIELD | Attending: Endocrinology | Admitting: Nutrition

## 2017-07-31 VITALS — BP 142/82 | HR 97 | Wt 240.0 lb

## 2017-07-31 DIAGNOSIS — Z713 Dietary counseling and surveillance: Secondary | ICD-10-CM | POA: Insufficient documentation

## 2017-07-31 DIAGNOSIS — E109 Type 1 diabetes mellitus without complications: Secondary | ICD-10-CM | POA: Insufficient documentation

## 2017-07-31 LAB — POCT GLYCOSYLATED HEMOGLOBIN (HGB A1C): HEMOGLOBIN A1C: 8.6

## 2017-07-31 MED ORDER — INSULIN ASPART 100 UNIT/ML FLEXPEN: PEN_INJECTOR | SUBCUTANEOUS | 1 refills | Status: DC

## 2017-07-31 MED ORDER — GLUCAGON (RDNA) 1 MG IJ KIT: 1.0000 mg | PACK | Freq: Once | INTRAMUSCULAR | 12 refills | Status: DC | PRN

## 2017-07-31 NOTE — Progress Notes (Signed)
Subjective:    Patient ID: Trevor Dalton, male    DOB: Jan 23, 1986, 32 y.o.   MRN: [id number]  HPI Pt returns for f/u of diabetes mellitus: DM type: Insulin-requiring type 2 Dx'ed: 1992 Complications: none Therapy: insulin since dx DKA: never Severe hypoglycemia: once, in 2005.    Pancreatitis: never.   Other: he takes multiple daily injections; he was ref to consider pump and continuous glucose monitor, but he did not go; he works 1st shift.      Interval history: He has had 3 episodes of severe hypoglycemia in the past week.  These happen at any time of day, but only after a meal is scheduled.  He takes lantus, 35 units twice a day, and novolog 1 unit/12 g CHO (averages approx 60 units/day).  However, he eats 2 meals/day.  Past Medical History:  Diagnosis Date  . Diabetes [facility])     Past Surgical History:  Procedure Laterality Date  . NOSE SURGERY      Social History   Socioeconomic History  . Marital status: Single    Spouse name: Not on file  . Number of children: Not on file  . Years of education: Not on file  . Highest education level: Not on file  Social Needs  . Financial resource strain: Not on file  . Food insecurity - worry: Not on file  . Food insecurity - inability: Not on file  . Transportation needs - medical: Not on file  . Transportation needs - non-medical: Not on file  Occupational History  . Not on file  Tobacco Use  . Smoking status: Former Games developer  . Smokeless tobacco: Never Used  Substance and Sexual Activity  . Alcohol use: Yes  . Drug use: Yes    Types: Marijuana  . Sexual activity: Not on file  Other Topics Concern  . Not on file  Social History Narrative  . Not on file    Current Outpatient Medications on File Prior to Visit  Medication Sig Dispense Refill  . glucose blood (CONTOUR NEXT TEST) test strip TEST FOUR TIMES DAILY 150 each 0  . Insulin Glargine (LANTUS SOLOSTAR) 100 UNIT/ML Solostar Pen Inject 30 Units into the skin 2  (two) times daily. 10 pen 1  . Insulin Pen Needle (B-D UF III MINI PEN NEEDLES) 31G X 5 MM MISC USE FOUR NEEDLES DAILY 100 each 0  . LANTUS SOLOSTAR 100 UNIT/ML Solostar Pen ADMINISTER 25 UNITS UNDER THE SKIN TWICE DAILY 15 mL 1  . meloxicam (MOBIC) 15 MG tablet Take 1 tablet (15 mg total) by mouth daily as needed. (Patient not taking: Reported on 01/25/2017) 10 tablet 0  . MICROLET LANCETS MISC USE 1 FOUR TIMES DAILY 100 each 0  . traMADol (ULTRAM) 50 MG tablet Take 1 tablet (50 mg total) by mouth every 8 (eight) hours as needed (Do not drive or operate machinery while taking as can cause drowsiness.). (Patient not taking: Reported on 01/25/2017) 12 tablet 0   No current facility-administered medications on file prior to visit.     No Known Allergies  History reviewed. No pertinent family history.  BP (!) 142/82 (BP Location: Left Wrist, Patient Position: Sitting, Cuff Size: Normal)   Pulse 97   Wt 240 lb (108.9 kg)   SpO2 98%   BMI 30.81 kg/m   Review of Systems He he gained 14 lbs since last ov.      Objective:   Physical Exam VITAL SIGNS:  See vs page GENERAL:  no distress Pulses: foot pulses are intact bilaterally.   MSK: no deformity of the feet or ankles.  CV: no edema of the legs or ankles Skin:  no ulcer on the feet or ankles.  normal color and temp on the feet and ankles Neuro: sensation is intact to touch on the feet and ankles.     Lab Results  Component Value Date   HGBA1C 8.6 07/31/2017      Assessment & Plan:  Type 1 DM: not well-controlled Hypoglycemia: worse.   Weight gain: this complicates the rx of DM.    Patient Instructions  check your blood sugar 4 times a day: before the 3 meals, and at bedtime.  also check if you have symptoms of your blood sugar being too high or too low.  please keep a record of the readings and bring it to your next appointment here.  You can write it on any piece of paper.  please call us sooner if your blood sugar goes below 70,  or if you have a lot of readings over 200.   Please reduce the lantus to 25 units twice a day, and:   Novolog: 1 unit for each 12 grams carbohydate, and 1 unit for each 50 by which your blood sugar exceeds 150.   Please continue to pursue the continuous glucose monitor.   Please come back for a follow-up appointment in 2 months.

## 2017-07-31 NOTE — Patient Instructions (Addendum)
check your blood sugar 4 times a day: before the 3 meals, and at bedtime.  also check if you have symptoms of your blood sugar being too high or too low.  please keep a record of the readings and bring it to your next appointment here.  You can write it on any piece of paper.  please call us sooner if your blood sugar goes below 70, or if you have a lot of readings over 200.   Please reduce the lantus to 25 units twice a day, and:   Novolog: 1 unit for each 12 grams carbohydate, and 1 unit for each 50 by which your blood sugar exceeds 150.   Please continue to pursue the continuous glucose monitor.   Please come back for a follow-up appointment in 2 months.

## 2017-08-02 NOTE — Patient Instructions (Signed)
Read over pamphlet given on Dexcom and call if questions.

## 2017-08-02 NOTE — Progress Notes (Signed)
Pt. Is here with his mother to learn about pump therapy and CGM.  He told me that he is not interested in insulin pumps at this time, but is very interested in CGM with alerts and alarms.  He reported that he has had 4 low blood sugars in the last 2 weeks with no symptoms.  Discussed this and was shown the 2 sensors that had alerts and alarms.  He wanted the Dexcom, and paperwork was filled out and faxed.  We also discussed pumps quickly, and was shown the 2 pumps he will be able to use with the Dexcom, and he again said that he "was not ready for this".  They were given the Dexcom Rep.'s name/number to call if they did not [facility] week.  Also reviewed subtle symptoms of low blood sugars to help him with recognition and treatment.   They had no final questions.

## 2017-09-09 ENCOUNTER — Other Ambulatory Visit: Payer: Self-pay | Admitting: Endocrinology

## 2017-10-03 ENCOUNTER — Ambulatory Visit: Payer: Self-pay | Admitting: Endocrinology

## 2017-10-03 DIAGNOSIS — Z0289 Encounter for other administrative examinations: Secondary | ICD-10-CM

## 2017-10-15 IMAGING — CR DG KNEE COMPLETE 4+V*R*
4 series · 4 of 4 positions shown · non-contrast
Comparison: None

CLINICAL DATA: Knee gave out on hand yesterday while playing
basketball, having medial, lateral and posterior pain with diffuse
swelling

EXAM:
RIGHT KNEE - COMPLETE 4+ VIEW

[knee ap (1 of 3)]
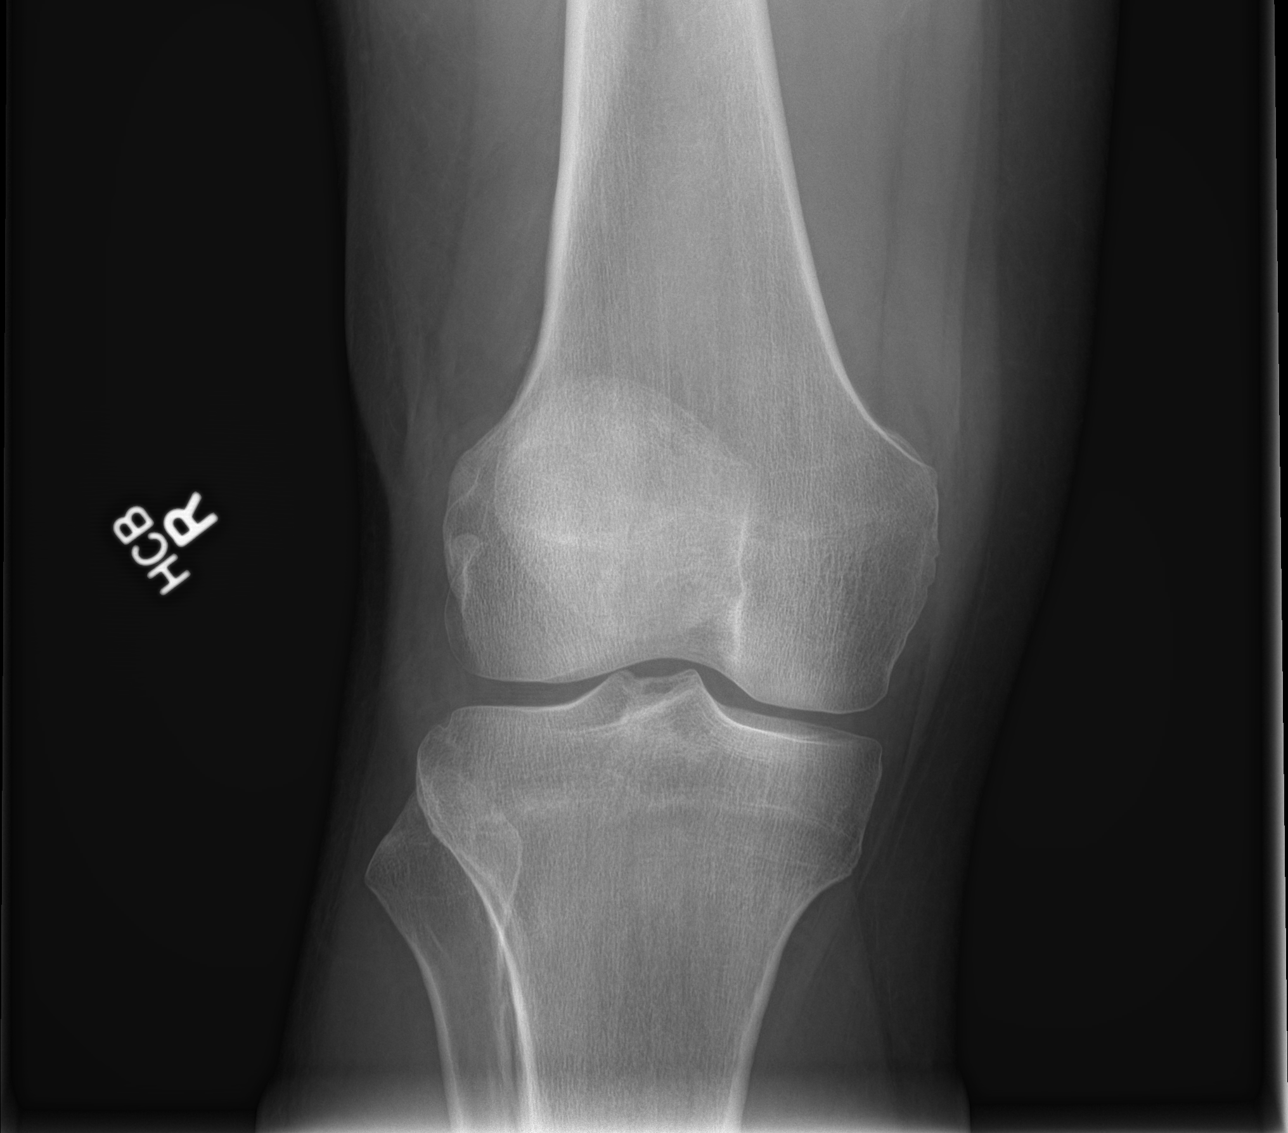

[knee ap (2 of 3)]
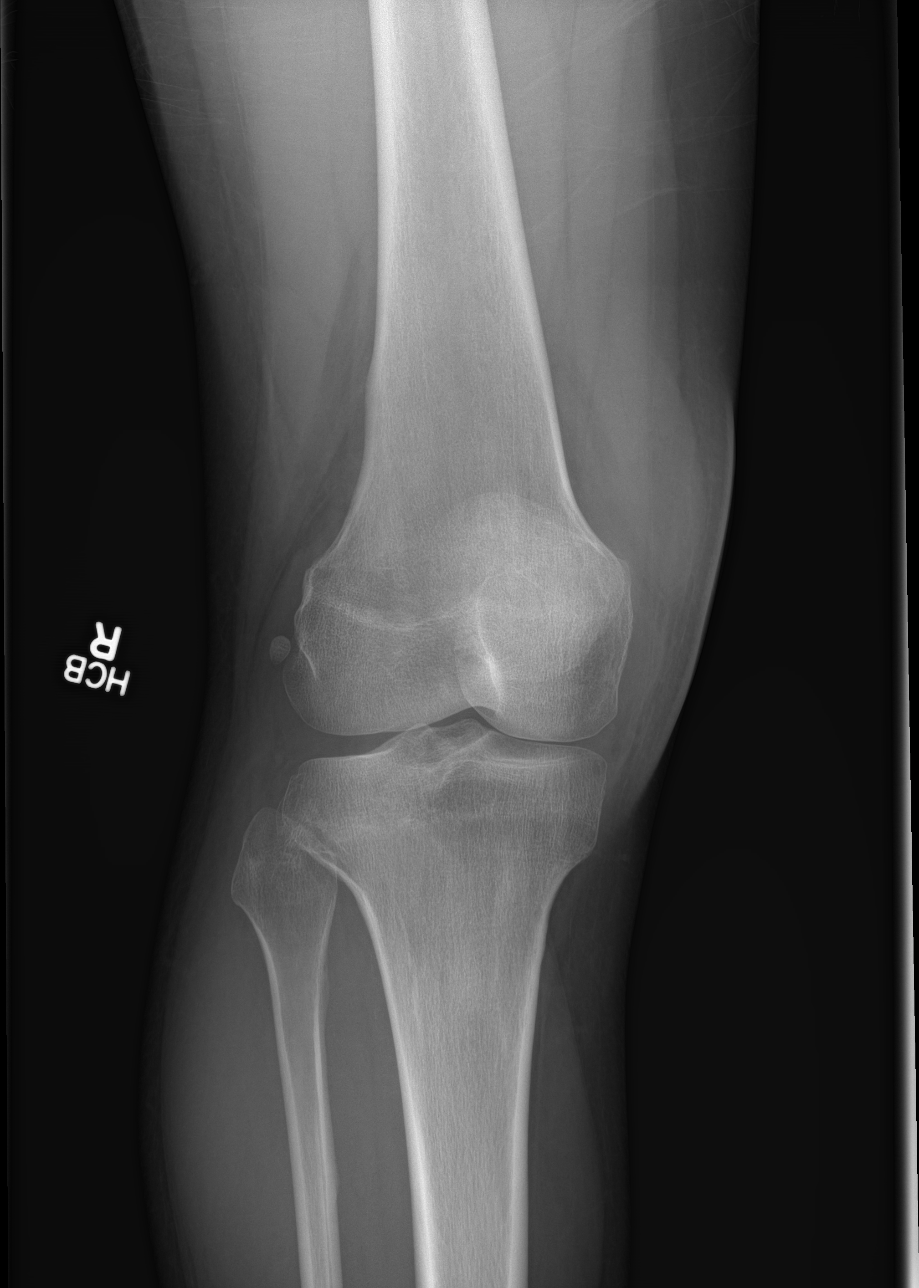

[knee ap (3 of 3)]
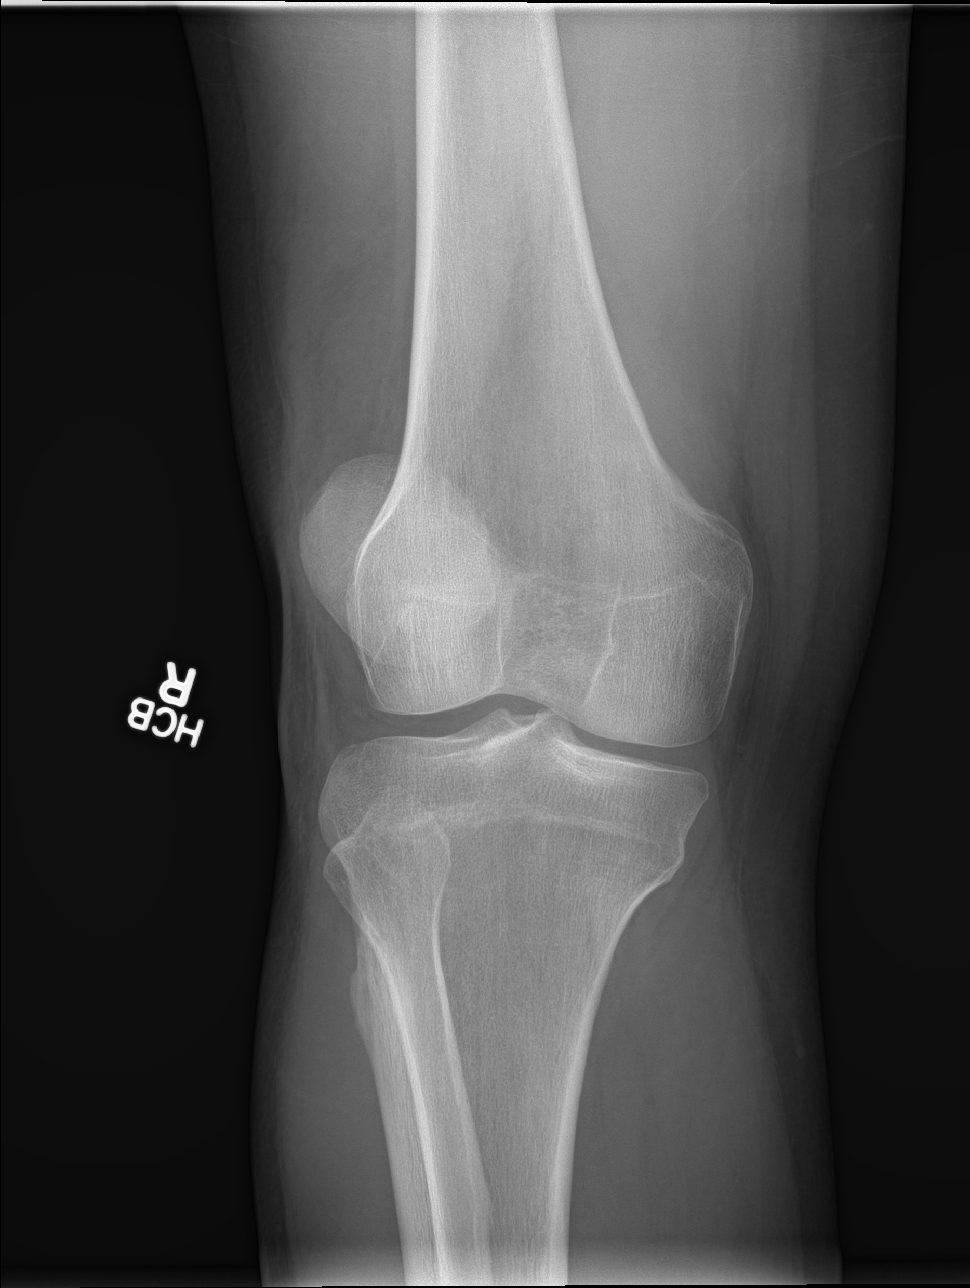

[knee lat]
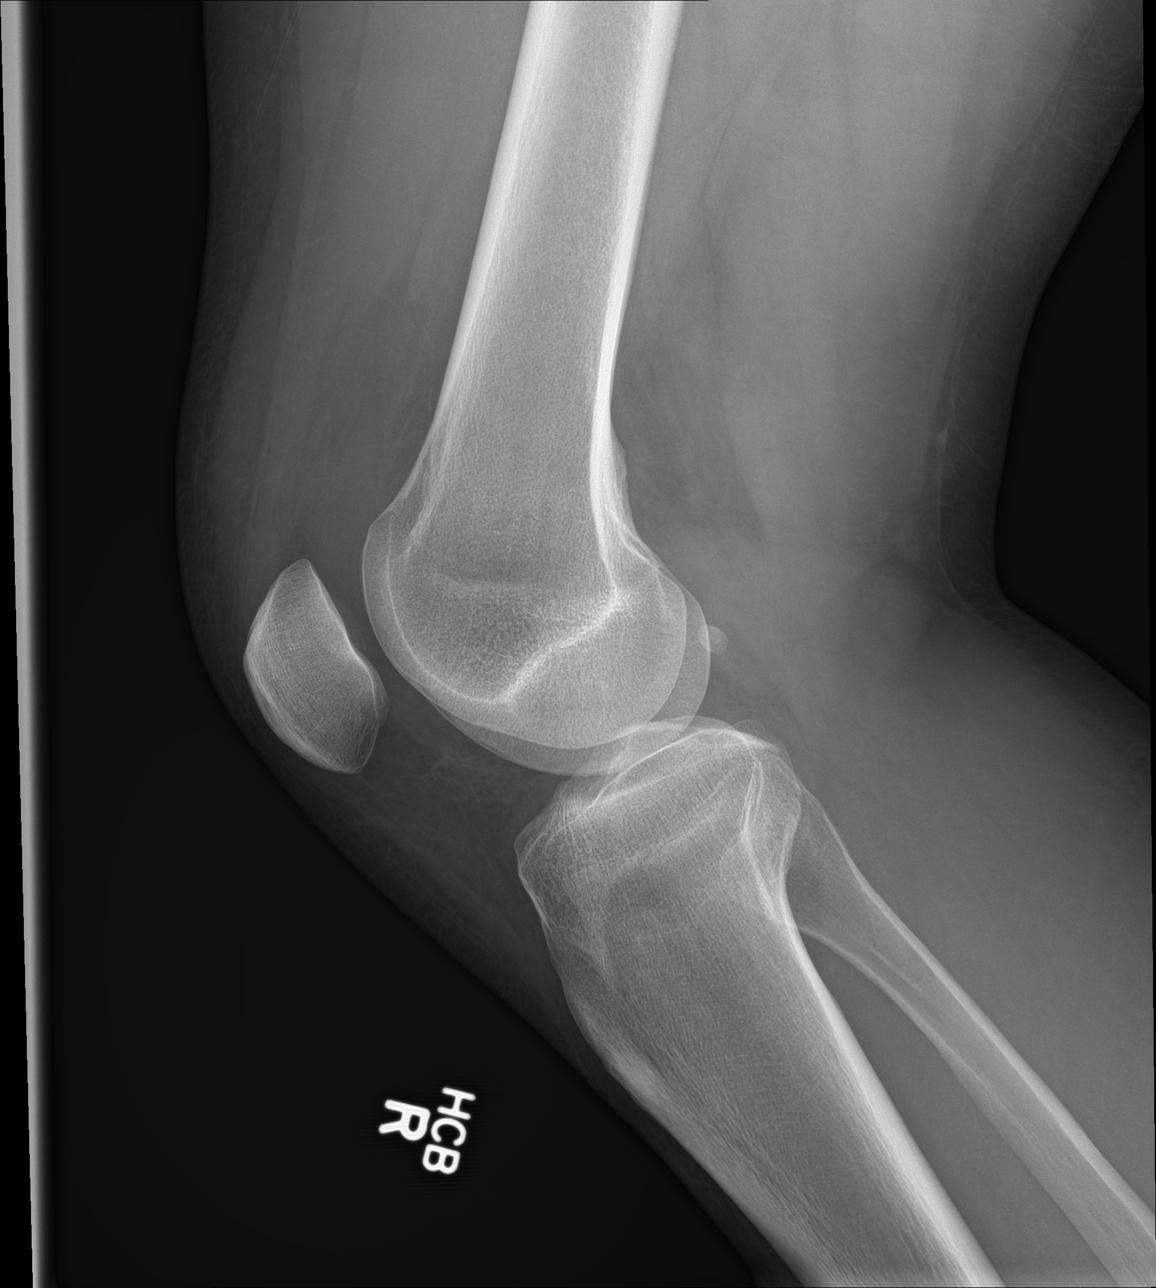

[4 of 4 positions shown; findings below may reference images not displayed]

FINDINGS: Osseous mineralization probably normal for technique.

Joint spaces preserved.

No acute fracture, dislocation, or bone destruction.

Small knee joint effusion.
IMPRESSION: Small knee joint effusion.

No acute osseous abnormalities.

## 2017-12-20 ENCOUNTER — Other Ambulatory Visit: Payer: Self-pay | Admitting: Endocrinology

## 2017-12-20 ENCOUNTER — Other Ambulatory Visit: Payer: Self-pay

## 2017-12-20 MED ORDER — INSULIN GLARGINE 100 UNIT/ML SOLOSTAR PEN: PEN_INJECTOR | SUBCUTANEOUS | 2 refills | Status: DC

## 2018-02-13 ENCOUNTER — Other Ambulatory Visit: Payer: Self-pay | Admitting: Endocrinology

## 2018-03-28 ENCOUNTER — Other Ambulatory Visit: Payer: Self-pay | Admitting: Endocrinology

## 2018-03-29 ENCOUNTER — Telehealth: Payer: Self-pay | Admitting: Endocrinology

## 2018-03-29 ENCOUNTER — Other Ambulatory Visit: Payer: Self-pay

## 2018-03-29 MED ORDER — INSULIN ASPART 100 UNIT/ML FLEXPEN: PEN_INJECTOR | SUBCUTANEOUS | 0 refills | Status: DC

## 2018-03-29 NOTE — Telephone Encounter (Signed)
Ordered refill and sent to pharmacy but pt needs an appt for any further refills, please schedule

## 2018-03-29 NOTE — Telephone Encounter (Signed)
°*  STAT* If patient is at the pharmacy, call can be transferred to refill team.   1. Which medications need to be refilled? (please list name of each medication and dose if known) Novolog pen  2. Which pharmacy/location (including street and city if local pharmacy) is medication to be sent to?[facility]  3. Do they need a 30 day or 90 day supply? 30 day

## 2018-04-25 ENCOUNTER — Telehealth: Payer: Self-pay | Admitting: Endocrinology

## 2018-04-25 ENCOUNTER — Other Ambulatory Visit: Payer: Self-pay

## 2018-04-25 MED ORDER — INSULIN GLARGINE 100 UNIT/ML SOLOSTAR PEN: 30.0000 [IU] | PEN_INJECTOR | Freq: Two times a day (BID) | SUBCUTANEOUS | 0 refills | Status: DC

## 2018-04-25 NOTE — Telephone Encounter (Signed)
Last appt 07/2017, was advised to f/u in 30mo but pt no showed for that appt. Called pt to address need for OV. Will send refill for 1 month supply but will need to schedule an appt.

## 2018-04-25 NOTE — Telephone Encounter (Signed)
°*  STAT* If patient is at the pharmacy, call can be transferred to refill team.   1. Which medications need to be refilled? (please list name of each medication and dose if known) Lantus  2. Which pharmacy/location (including street and city if local pharmacy) is medication to be sent to?[facility]

## 2018-04-29 ENCOUNTER — Telehealth: Payer: Self-pay | Admitting: Endocrinology

## 2018-04-29 NOTE — Telephone Encounter (Signed)
Rx refill for Lantus was [facility] to [facility]; E-Prescribing Status: Receipt confirmed by pharmacy (04/25/2018 2:18 PM EDT). Called pt to make him aware. LVM advising him to call [facility] re: status of his Rx refill.

## 2018-04-29 NOTE — Telephone Encounter (Signed)
Per [facility] on 10/25 "Caller states his prescription was not sent to the pharmacy and he is out of insulin - Lantus 25 units subQ each am and each pm."

## 2018-05-16 ENCOUNTER — Ambulatory Visit: Payer: Self-pay | Admitting: Endocrinology

## 2018-05-22 ENCOUNTER — Other Ambulatory Visit: Payer: Self-pay | Admitting: Endocrinology

## 2018-06-25 ENCOUNTER — Other Ambulatory Visit: Payer: Self-pay | Admitting: Endocrinology

## 2018-06-25 NOTE — Telephone Encounter (Signed)
Last OV 07/31/17 refill or refuse please advise

## 2018-06-25 NOTE — Telephone Encounter (Signed)
Please refill x 1 Ov is due  

## 2018-07-16 ENCOUNTER — Other Ambulatory Visit: Payer: Self-pay | Admitting: Endocrinology

## 2018-07-22 ENCOUNTER — Other Ambulatory Visit: Payer: Self-pay | Admitting: Endocrinology

## 2018-07-23 ENCOUNTER — Other Ambulatory Visit: Payer: Self-pay

## 2018-07-23 ENCOUNTER — Telehealth: Payer: Self-pay | Admitting: Endocrinology

## 2018-07-23 DIAGNOSIS — E109 Type 1 diabetes mellitus without complications: Secondary | ICD-10-CM

## 2018-07-23 MED ORDER — BASAGLAR KWIKPEN 100 UNIT/ML ~~LOC~~ SOPN: 30.0000 [IU] | PEN_INJECTOR | Freq: Two times a day (BID) | SUBCUTANEOUS | 11 refills | Status: DC

## 2018-07-23 MED ORDER — INSULIN GLARGINE 100 UNIT/ML SOLOSTAR PEN: 30.0000 [IU] | PEN_INJECTOR | Freq: Two times a day (BID) | SUBCUTANEOUS | 0 refills | Status: DC

## 2018-07-23 NOTE — Progress Notes (Signed)
Insurance has denied Lantus Solostar. Recommending Basaglar. Per Dr. Ellison, advised to send new Rx to [facility]. Rx sent as ordered. Called pt to make him aware. LVM requesting returned call. 

## 2018-07-23 NOTE — Telephone Encounter (Signed)
Insurance has denied Investment banker, corporateLantus Solostar. Recommending Basaglar. Per Dr. Everardo AllEllison, [facility] Rx sent as ordered. Called pt to make him aware. LVM requesting returned call.

## 2018-07-23 NOTE — Telephone Encounter (Signed)
Insulin Glargine (LANTUS SOLOSTAR) 100 UNIT/ML Solostar Pen 15 mL 0 07/23/2018    Sig - Route: Inject 30 Units into the skin 2 (two) times daily. - Subcutaneous   Sent to pharmacy as: Insulin Glargine (LANTUS SOLOSTAR) 100 UNIT/ML Solostar Pen   E-Prescribing Status: Sent to pharmacy (07/23/2018 11:35 AM EST)

## 2018-07-23 NOTE — Telephone Encounter (Signed)
MEDICATION: LANTUS SOLOSTAR 100 UNIT/ML Solostar Pen  PHARMACY:  [facility] [phone]   IS THIS A 90 DAY SUPPLY :   IS PATIENT OUT OF MEDICATION:   IF NOT; HOW MUCH IS LEFT: 1/2 pen  LAST APPOINTMENT DATE: @1 /29/2019  NEXT APPOINTMENT DATE:@1 /30/2020  DO WE HAVE YOUR PERMISSION TO LEAVE A DETAILED MESSAGE:  OTHER COMMENTS:    **Let patient know to contact pharmacy at the end of the day to make sure medication is ready. **  ** Please notify patient to allow 48-72 hours to process**  **Encourage patient to contact the pharmacy for refills or they can request refills through [facility]**

## 2018-08-01 ENCOUNTER — Encounter: Payer: Self-pay | Admitting: Endocrinology

## 2018-08-01 ENCOUNTER — Ambulatory Visit: Payer: Self-pay | Admitting: Endocrinology

## 2018-08-01 VITALS — BP 140/82 | HR 99 | Ht 74.0 in | Wt 236.0 lb

## 2018-08-01 DIAGNOSIS — E109 Type 1 diabetes mellitus without complications: Secondary | ICD-10-CM

## 2018-08-01 LAB — POCT GLYCOSYLATED HEMOGLOBIN (HGB A1C): HEMOGLOBIN A1C: 8.6 % — AB (ref 4.0–5.6)

## 2018-08-01 NOTE — Patient Instructions (Addendum)
check your blood sugar 4 times a day: before the 3 meals, and at bedtime.  also check if you have symptoms of your blood sugar being too high or too low.  please keep a record of the readings and bring it to your next appointment here.  You can write it on any piece of paper.  please call us sooner if your blood sugar goes below 70, or if you have a lot of readings over 200.   Please continue the same basaglar: 30 units twice a day, and:  Novolog: 1 unit for each 10 grams carbohydate, and 1 unit for each 50 by which your blood sugar exceeds 150.   Please come back for a follow-up appointment in 2 months.

## 2018-08-01 NOTE — Progress Notes (Signed)
Subjective:    Patient ID: Trevor Dalton, male    DOB: 03/01/1986, 33 y.o.   MRN: [id number]  HPI Pt returns for f/u of diabetes mellitus: DM type: Insulin-requiring type 2 Dx'ed: 1992 Complications: none Therapy: insulin since dx DKA: never Severe hypoglycemia: last episode was early 2019.  Pancreatitis: never.   Other: he takes multiple daily injections; he was ref CDE to to consider pump and continuous glucose monitor, but he did not go for appt; he works 1st shift.      Interval history:  no cbg record, but states cbg varies from 100's-200's.  There is no trend throughout the day.  pt states he feels well in general.  He seldom misses the insulin.  He is eating 3 meals per day.  He takes approx 10-12 units of novolog 3 times a day (just before each meal).   Past Medical History:  Diagnosis Date  . Diabetes [facility](HCC)     Past Surgical History:  Procedure Laterality Date  . NOSE SURGERY      Social History   Socioeconomic History  . Marital status: Single    Spouse name: Not on file  . Number of children: Not on file  . Years of education: Not on file  . Highest education level: Not on file  Occupational History  . Not on file  Social Needs  . Financial resource strain: Not on file  . Food insecurity:    Worry: Not on file    Inability: Not on file  . Transportation needs:    Medical: Not on file    Non-medical: Not on file  Tobacco Use  . Smoking status: Former Games developermoker  . Smokeless tobacco: Never Used  Substance and Sexual Activity  . Alcohol use: Yes  . Drug use: Yes    Types: Marijuana  . Sexual activity: Not on file  Lifestyle  . Physical activity:    Days per week: Not on file    Minutes per session: Not on file  . Stress: Not on file  Relationships  . Social connections:    Talks on phone: Not on file    Gets together: Not on file    Attends religious service: Not on file    Active member of club or organization: Not on file    Attends meetings of  clubs or organizations: Not on file    Relationship status: Not on file  . Intimate partner violence:    Fear of current or ex partner: Not on file    Emotionally abused: Not on file    Physically abused: Not on file    Forced sexual activity: Not on file  Other Topics Concern  . Not on file  Social History Narrative  . Not on file    Current Outpatient Medications on File Prior to Visit  Medication Sig Dispense Refill  . CONTOUR NEXT TEST test strip TEST FOUR TIMES DAILY 150 each 2  . glucagon 1 MG injection Inject 1 mg into the muscle once as needed for up to 1 dose. 1 each 12  . Insulin Glargine (BASAGLAR KWIKPEN) 100 UNIT/ML SOPN Inject 0.3 mLs (30 Units total) into the skin 2 (two) times daily. 3 pen 11  . Insulin Pen Needle (B-D UF III MINI PEN NEEDLES) 31G X 5 MM MISC USE FOUR NEEDLES DAILY 100 each 0  . meloxicam (MOBIC) 15 MG tablet Take 1 tablet (15 mg total) by mouth daily as needed. 10 tablet 0  .  MICROLET LANCETS MISC USE 1 FOUR TIMES DAILY 100 each 0  . NOVOLOG FLEXPEN 100 UNIT/ML FlexPen INJECT 1 UNIT PER 12 GRAMS CARBOHYDRATE& 1 UNIT FOR EACH 50 WHICH CBG EXCEEDS 150 FOR TOTAL OF 60 UNITS PER DAY. 30 mL 0   No current facility-administered medications on file prior to visit.     No Known Allergies  No family history on file.  BP 140/82 (BP Location: Left Arm, Patient Position: Sitting, Cuff Size: Large)   Pulse 99   Ht 6\' 2"  (1.88 m)   Wt 236 lb (107 kg)   SpO2 95%   BMI 30.30 kg/m   Review of Systems He denies hypoglycemia.      Objective:   Physical Exam VITAL SIGNS:  See vs page GENERAL: no distress Pulses: dorsalis pedis intact bilat.   MSK: no deformity of the feet CV: no leg edema Skin:  no ulcer on the feet.  normal color and temp on the feet. Neuro: sensation is intact to touch on the feet  A1c=8.6%     Assessment & Plan:  Insulin-requiring type 2 DM: he needs increased rx noncompliance with cbg recording and f/u ov's. He may need to d/c  multiple daily injections, but we'll first try increasing same rx.    Patient Instructions  check your blood sugar 4 times a day: before the 3 meals, and at bedtime.  also check if you have symptoms of your blood sugar being too high or too low.  please keep a record of the readings and bring it to your next appointment here.  You can write it on any piece of paper.  please call us sooner if your blood sugar goes below 70, or if you have a lot of readings over 200.   Please continue the same basaglar: 30 units twice a day, and:  Novolog: 1 unit for each 10 grams carbohydate, and 1 unit for each 50 by which your blood sugar exceeds 150.   Please come back for a follow-up appointment in 2 months.

## 2018-10-03 ENCOUNTER — Other Ambulatory Visit: Payer: Self-pay | Admitting: Endocrinology

## 2018-10-03 DIAGNOSIS — E109 Type 1 diabetes mellitus without complications: Secondary | ICD-10-CM

## 2018-10-04 ENCOUNTER — Telehealth: Payer: Self-pay | Admitting: Endocrinology

## 2018-10-04 NOTE — Telephone Encounter (Signed)
MEDICATION: Lantus and Novolog  PHARMACY:  [facility] [phone]  IS THIS A 90 DAY SUPPLY :   IS PATIENT OUT OF MEDICATION: Yes  IF NOT; HOW MUCH IS LEFT:   LAST APPOINTMENT DATE: @4 /08/2018  NEXT APPOINTMENT DATE:@Visit  date not found  DO WE HAVE YOUR PERMISSION TO LEAVE A DETAILED MESSAGE:  OTHER COMMENTS:    **Let patient know to contact pharmacy at the end of the day to make sure medication is ready. **  ** Please notify patient to allow 48-72 hours to process**  **Encourage patient to contact the pharmacy for refills or they can request refills through [facility]**

## 2018-10-04 NOTE — Telephone Encounter (Signed)
Sent in Rx's as appropriate/thx dmf

## 2018-11-06 ENCOUNTER — Other Ambulatory Visit: Payer: Self-pay

## 2018-11-06 ENCOUNTER — Telehealth: Payer: Self-pay | Admitting: Endocrinology

## 2018-11-06 DIAGNOSIS — E109 Type 1 diabetes mellitus without complications: Secondary | ICD-10-CM

## 2018-11-06 MED ORDER — DEXCOM G6 TRANSMITTER MISC: 1.0000 | 3 refills | Status: DC

## 2018-11-06 MED ORDER — DEXCOM G6 SENSOR MISC: 1.0000 | 11 refills | Status: DC

## 2018-11-06 MED ORDER — DEXCOM G6 RECEIVER DEVI: 1.0000 | 0 refills | Status: DC

## 2018-11-06 NOTE — Telephone Encounter (Signed)
MEDICATION: DEXCOM 6 sensors 1 box per month with 11 refills (per DEXCOM rep)   DEXCOM 6  Transmitter 1 box every 90 days 3 refills (per DEXCOM rep)   DEXCOM 6 Receiver 1  PHARMACY:  [facility] on [address]  IS THIS A 90 DAY SUPPLY : see above  IS PATIENT OUT OF MEDICATION: new  IF NOT; HOW MUCH IS LEFT: new  LAST APPOINTMENT DATE: @4 /09/2018  NEXT APPOINTMENT DATE:@Visit  date not found  DO WE HAVE YOUR PERMISSION TO LEAVE A DETAILED MESSAGE: YES, call back number [phone]  OTHER COMMENTS: DEXCOM rep gave patient the amounts, etc   **Let patient know to contact pharmacy at the end of the day to make sure medication is ready. **  ** Please notify patient to allow 48-72 hours to process**  **Encourage patient to contact the pharmacy for refills or they can request refills through [facility]**

## 2018-11-06 NOTE — Telephone Encounter (Signed)
E-Prescribing Status: Receipt confirmed by pharmacy (11/06/2018 4:02 PM EDT)

## 2018-12-03 DIAGNOSIS — H18231 Secondary corneal edema, right eye: Secondary | ICD-10-CM | POA: Diagnosis not present

## 2018-12-04 ENCOUNTER — Telehealth: Payer: Self-pay

## 2018-12-04 NOTE — Telephone Encounter (Signed)
LOV 08/01/18. Per Dr. Everardo All, f/u 2 mo. Called to schedule appt. LVM requesting returned call.

## 2019-01-12 ENCOUNTER — Other Ambulatory Visit: Payer: Self-pay | Admitting: Endocrinology

## 2019-01-12 DIAGNOSIS — E109 Type 1 diabetes mellitus without complications: Secondary | ICD-10-CM

## 2019-01-12 NOTE — Telephone Encounter (Signed)
Please refill x 1 F/u is due  

## 2019-01-23 ENCOUNTER — Telehealth: Payer: Self-pay

## 2019-01-23 NOTE — Telephone Encounter (Signed)
Received notification from [employer] to provide most recent office notes and labs as well as complete orders. LOV 08/01/18. Per Dr. Loanne Drilling, pt will require an OV to provide required information to [employer]. This message routed to [employer] for scheduling purposes.

## 2019-01-29 ENCOUNTER — Telehealth: Payer: Self-pay

## 2019-01-29 NOTE — Telephone Encounter (Signed)
LMTCB to schedule appointment °

## 2019-01-29 NOTE — Telephone Encounter (Signed)
Received request from [employer] to complete CMN. Per Dr. Loanne Drilling, pt will require an appt in order for him to complete. This message routed to the front desk for scheduling purposes.

## 2019-01-30 NOTE — Telephone Encounter (Signed)
LMTCB x 2 (Second attempt) to schedule appointment

## 2019-02-06 ENCOUNTER — Telehealth: Payer: Self-pay | Admitting: Endocrinology

## 2019-02-06 DIAGNOSIS — R21 Rash and other nonspecific skin eruption: Secondary | ICD-10-CM | POA: Diagnosis not present

## 2019-02-06 DIAGNOSIS — L0591 Pilonidal cyst without abscess: Secondary | ICD-10-CM | POA: Diagnosis not present

## 2019-02-06 DIAGNOSIS — Z7689 Persons encountering health services in other specified circumstances: Secondary | ICD-10-CM | POA: Diagnosis not present

## 2019-02-06 DIAGNOSIS — E109 Type 1 diabetes mellitus without complications: Secondary | ICD-10-CM

## 2019-02-06 NOTE — Telephone Encounter (Signed)
This pt is not returning our calls to schedule an appt. Per Dr. Loanne Drilling, refill cannot be sent until appt has been scheduled

## 2019-02-06 NOTE — Telephone Encounter (Signed)
Please advise how you wish to proceed. 3 attempts have been made to reach out to this pt for an appt. He has failed to return any calls.

## 2019-02-06 NOTE — Telephone Encounter (Signed)
1.  Make f/u appt 2. Then refill x 1, pending that appt.

## 2019-02-10 ENCOUNTER — Other Ambulatory Visit: Payer: Self-pay

## 2019-02-10 DIAGNOSIS — E1065 Type 1 diabetes mellitus with hyperglycemia: Secondary | ICD-10-CM | POA: Diagnosis not present

## 2019-02-10 DIAGNOSIS — E109 Type 1 diabetes mellitus without complications: Secondary | ICD-10-CM

## 2019-02-10 MED ORDER — LANTUS SOLOSTAR 100 UNIT/ML ~~LOC~~ SOPN: PEN_INJECTOR | SUBCUTANEOUS | 0 refills | Status: DC

## 2019-02-10 NOTE — Telephone Encounter (Signed)
Patient has called and informed he is now completly out of his Lantus. He scheduled next available on 02/25/19.

## 2019-02-10 NOTE — Telephone Encounter (Signed)
Insulin Glargine (LANTUS SOLOSTAR) 100 UNIT/ML Solostar Pen 9 mL 0 02/10/2019    Sig: ADMINISTER 30 UNITS UNDER THE SKIN TWICE DAILY   Sent to pharmacy as: Insulin Glargine (LANTUS SOLOSTAR) 100 UNIT/ML Solostar Pen   Notes to Pharmacy: Will provide 30 day supply. Overdue for an appt. Must schedule an appt in order to authorize future refill requests   E-Prescribing Status: Receipt confirmed by pharmacy (02/10/2019 3:37 PM EDT)    Per Dr. Cordelia Pen previous orders, 30 day supply sent

## 2019-02-25 ENCOUNTER — Ambulatory Visit: Payer: BC Managed Care – PPO | Admitting: Endocrinology

## 2019-02-25 ENCOUNTER — Other Ambulatory Visit: Payer: Self-pay

## 2019-02-25 ENCOUNTER — Encounter: Payer: Self-pay | Admitting: Endocrinology

## 2019-02-25 VITALS — BP 134/90 | HR 116 | Ht 74.0 in | Wt 274.0 lb

## 2019-02-25 DIAGNOSIS — E109 Type 1 diabetes mellitus without complications: Secondary | ICD-10-CM

## 2019-02-25 DIAGNOSIS — E10649 Type 1 diabetes mellitus with hypoglycemia without coma: Secondary | ICD-10-CM

## 2019-02-25 LAB — POCT GLYCOSYLATED HEMOGLOBIN (HGB A1C): Hemoglobin A1C: 6.5 % — AB (ref 4.0–5.6)

## 2019-02-25 MED ORDER — OZEMPIC (0.25 OR 0.5 MG/DOSE) 2 MG/1.5ML ~~LOC~~ SOPN: 0.2500 mg | PEN_INJECTOR | SUBCUTANEOUS | 11 refills | Status: DC

## 2019-02-25 NOTE — Patient Instructions (Addendum)
Your blood pressure is high today.  Please see your primary care provider soon, to have it rechecked I have sent a prescription to your pharmacy, to add "Ozempic." check your blood sugar 4 times a day: before the 3 meals, and at bedtime.  also check if you have symptoms of your blood sugar being too high or too low.  please keep a record of the readings and bring it to your next appointment here.  You can write it on any piece of paper.  please call us sooner if your blood sugar goes below 70, or if you have a lot of readings over 200.   Please reduce the basaglar to 25 units twice a day, and:  Continue novolog: 1 unit for each 15 grams carbohydate, and 1 unit for each 50 by which your blood sugar exceeds 150.   Blood tests are requested for you today.  We'll let you know about the results.  Please come back for a follow-up appointment in 2 months. When you start the pump, please use these settings: basal rate of 1.3 units/hr.  bolus of 1 unit/15 grams carbohydrate correction bolus (which some people call "sensitivity," or "insulin sensitivity ratio," or just "isr") of 1 unit for each 50 by which your glucose exceeds 120

## 2019-02-25 NOTE — Progress Notes (Signed)
Subjective:    Patient ID: Trevor Dalton, male    DOB: 1986-05-17, 33 y.o.   MRN: [id number]  HPI Pt returns for f/u of diabetes mellitus: DM type: Insulin-requiring type 2 Dx'ed: 7829 Complications: none Therapy: insulin since dx DKA: never Severe hypoglycemia: last episode was early 2019.  Pancreatitis: never.   Other: he takes multiple daily injections, but he will start pump rx soon.  he works 1st shift.      Interval history:  I reviewed continuous glucose monitor data.  Glucose varies from 50-260.  It is in general lowest 11 PM-2 AM.  pt states he feels well in general, except for weight gain.  He seldom misses the insulin.  He takes approx 20 units of novolog 3 times a day (just before each meal).   Past Medical History:  Diagnosis Date  . Diabetes [facility])     Past Surgical History:  Procedure Laterality Date  . NOSE SURGERY      Social History   Socioeconomic History  . Marital status: Single    Spouse name: Not on file  . Number of children: Not on file  . Years of education: Not on file  . Highest education level: Not on file  Occupational History  . Not on file  Social Needs  . Financial resource strain: Not on file  . Food insecurity    Worry: Not on file    Inability: Not on file  . Transportation needs    Medical: Not on file    Non-medical: Not on file  Tobacco Use  . Smoking status: Former Research scientist (life sciences)  . Smokeless tobacco: Never Used  Substance and Sexual Activity  . Alcohol use: Yes  . Drug use: Yes    Types: Marijuana  . Sexual activity: Not on file  Lifestyle  . Physical activity    Days per week: Not on file    Minutes per session: Not on file  . Stress: Not on file  Relationships  . Social Herbalist on phone: Not on file    Gets together: Not on file    Attends religious service: Not on file    Active member of club or organization: Not on file    Attends meetings of clubs or organizations: Not on file    Relationship status:  Not on file  . Intimate partner violence    Fear of current or ex partner: Not on file    Emotionally abused: Not on file    Physically abused: Not on file    Forced sexual activity: Not on file  Other Topics Concern  . Not on file  Social History Narrative  . Not on file    Current Outpatient Medications on File Prior to Visit  Medication Sig Dispense Refill  . Continuous Blood Gluc Receiver ([place]) [place] 1 each by Does not apply route See admin instructions. Use to monitor glucose levels; E10.9 1 Device 0  . Continuous Blood Gluc Sensor (DEXCOM G6 SENSOR) MISC 1 each by Does not apply route See admin instructions. Change sensor every 10 days; E10.9 1 each 11  . Continuous Blood Gluc Transmit (DEXCOM G6 TRANSMITTER) MISC 1 each by Does not apply route See admin instructions. Use to monitor glucose levels; E10.9 1 each 3  . glucagon 1 MG injection Inject 1 mg into the muscle once as needed for up to 1 dose. 1 each 12  . Insulin Glargine (LANTUS SOLOSTAR) 100 UNIT/ML Solostar  Pen ADMINISTER 30 UNITS UNDER THE SKIN TWICE DAILY (Patient taking differently: 32 Units. ADMINISTER 30 UNITS UNDER THE SKIN TWICE DAILY) 9 mL 0  . meloxicam (MOBIC) 15 MG tablet Take 1 tablet (15 mg total) by mouth daily as needed. 10 tablet 0  . MICROLET LANCETS MISC USE 1 FOUR TIMES DAILY 100 each 0   No current facility-administered medications on file prior to visit.     No Known Allergies  No family history on file.  BP 134/90 (BP Location: Left Arm, Patient Position: Sitting, Cuff Size: Large)   Pulse (!) 116   Ht 6\' 2"  (1.88 m)   Wt 274 lb (124.3 kg)   SpO2 95%   BMI 35.18 kg/m    Review of Systems He denies LOC.      Objective:   Physical Exam VITAL SIGNS:  See vs page GENERAL: no distress Pulses: dorsalis pedis intact bilat.   MSK: no deformity of the feet CV: no leg edema Skin:  no ulcer on the feet.  normal color and temp on the feet. Neuro: sensation is intact to touch on  the feet.   Lab Results  Component Value Date   HGBA1C 6.5 (A) 02/25/2019       Assessment & Plan:  HTN: is noted  Type 1 DM: slightly overcontrolled.  Hypoglycemia: this limits aggressiveness of glycemic control.    Patient Instructions  Your blood pressure is high today.  Please see your primary care provider soon, to have it rechecked I have sent a prescription to your pharmacy, to add "Ozempic." check your blood sugar 4 times a day: before the 3 meals, and at bedtime.  also check if you have symptoms of your blood sugar being too high or too low.  please keep a record of the readings and bring it to your next appointment here.  You can write it on any piece of paper.  please call us sooner if your blood sugar goes below 70, or if you have a lot of readings over 200.   Please reduce the basaglar to 25 units twice a day, and:  Continue novolog: 1 unit for each 15 grams carbohydate, and 1 unit for each 50 by which your blood sugar exceeds 150.   Blood tests are requested for you today.  We'll let you know about the results.  Please come back for a follow-up appointment in 2 months. When you start the pump, please use these settings: basal rate of 1.3 units/hr.  bolus of 1 unit/15 grams carbohydrate correction bolus (which some people call "sensitivity," or "insulin sensitivity ratio," or just "isr") of 1 unit for each 50 by which your glucose exceeds 120

## 2019-02-26 ENCOUNTER — Other Ambulatory Visit: Payer: Self-pay

## 2019-02-26 ENCOUNTER — Encounter: Payer: BC Managed Care – PPO | Attending: Endocrinology | Admitting: Nutrition

## 2019-02-26 DIAGNOSIS — E1065 Type 1 diabetes mellitus with hyperglycemia: Secondary | ICD-10-CM | POA: Insufficient documentation

## 2019-02-26 DIAGNOSIS — E109 Type 1 diabetes mellitus without complications: Secondary | ICD-10-CM

## 2019-02-26 MED ORDER — INSULIN ASPART 100 UNIT/ML ~~LOC~~ SOLN: 90.0000 [IU] | SUBCUTANEOUS | 2 refills | Status: DC

## 2019-02-26 MED ORDER — BD PEN NEEDLE MINI U/F 31G X 5 MM MISC: 0 refills | Status: DC

## 2019-02-27 ENCOUNTER — Other Ambulatory Visit: Payer: Self-pay

## 2019-02-27 DIAGNOSIS — E109 Type 1 diabetes mellitus without complications: Secondary | ICD-10-CM

## 2019-02-27 MED ORDER — INSULIN LISPRO 100 UNIT/ML ~~LOC~~ SOLN: SUBCUTANEOUS | 2 refills | Status: DC

## 2019-02-28 NOTE — Progress Notes (Signed)
Trevor Dalton had been on a pump "several years ago".  He is her to start his [employer] pump- control IQ.   He came in with date/time set on his pump.   We discussed how pumps deliver insulin, and settings were put in by the patient Per Dr. Cordelia Pen orders:  Basal rate:  1.3u/hr., IC ratio 15, ISF: 50, timing: 5 hours, target: 110.   We reviewed how to give a bolus, and he re demonstrated this correctly X2.  He reports that he knows how to count carbs and did not want any further information about this--though he is not counting carbs at this time.  He was strongly advised of the need to do this.  He reported good understanding of this.   He filled a cartridge and inserted it into his right abdomen--which has much scare tissue.  We reviewed the need to change these sites due to poor insulin absorbtion, but he did not want to do this at this time.  Other sites were given for him to try, but denied these at this time.  He was also given suggestions for how to breakdown this scar tissue, and he agreed to try this.   He is already using the Dexcom G6, and we linked it to his pump successfully  He also downloaded the [employer]andem t-connect app witch allows for continuous streaming his blood sugars to the cloud, and gave [place] permission to view these results.    We also reviewed extended bolus, eating high fat meals, sick days, and high blood sugar protocols.  He had no final questions and signed off the checklist as understanding all topics with no final questions.  . Handouts were given on how to start/stop the pump, change pump settings, give a bolus and review status screen.

## 2019-02-28 NOTE — Patient Instructions (Signed)
Review handouts given and manual Call tandem help line if questions about pump usage. Call office if blood sugar readings remain high or the basal control continues to stop the flow of basal insulin.   Set up an appointment for Dr. Loanne Drilling in one week.

## 2019-03-03 ENCOUNTER — Other Ambulatory Visit: Payer: Self-pay | Admitting: Endocrinology

## 2019-03-04 ENCOUNTER — Telehealth: Payer: Self-pay | Admitting: Nutrition

## 2019-03-05 ENCOUNTER — Encounter: Payer: Self-pay | Admitting: Endocrinology

## 2019-03-05 ENCOUNTER — Other Ambulatory Visit: Payer: Self-pay

## 2019-03-05 ENCOUNTER — Ambulatory Visit (INDEPENDENT_AMBULATORY_CARE_PROVIDER_SITE_OTHER): Payer: BC Managed Care – PPO | Admitting: Endocrinology

## 2019-03-05 VITALS — Ht 74.0 in

## 2019-03-05 DIAGNOSIS — E1065 Type 1 diabetes mellitus with hyperglycemia: Secondary | ICD-10-CM | POA: Diagnosis not present

## 2019-03-05 NOTE — Patient Instructions (Addendum)
Please continue the same Ozempic. As you are on this, you may find this reduces your insulin needs.   Please come back for a follow-up appointment in 2 weeks. please continue these settings:  basal rate of 1.3 units/hr.  bolus of 1 unit/15 grams carbohydrate.   correction bolus (which some people call "sensitivity," or "insulin sensitivity ratio," or just "isr") of 1 unit for each 50 by which your glucose exceeds 120.

## 2019-03-05 NOTE — Progress Notes (Signed)
Subjective:    Patient ID: Trevor Dalton, male    DOB: 08/29/1985, 33 y.o.   MRN: [id number]  HPI telehealth visit today via doxy video visit.  Alternatives to telehealth are presented to this patient, and the patient agrees to the telehealth visit. Pt is advised of the cost of the visit, and agrees to this, also.   Patient is at home, and I am at the office.   Persons attending the telehealth visit: the patient and I.   Pt returns for f/u of diabetes mellitus: DM type: Insulin-requiring type 2 Dx'ed: 1992 Complications: none Therapy: insulin since dx DKA: never Severe hypoglycemia: last episode was early 2019.  Pancreatitis: never.   Other: he takes multiple daily injections, he works 1st shift.  Interval history:  He started pump rx last week.  He takes these settings:   basal rate of 1.3 units/hr.  bolus of 1 unit/15 grams carbohydrate.   correction bolus (which some people call "sensitivity," or "insulin sensitivity ratio," or just "isr") of 1 unit for each 50 by which your glucose exceeds 120.   Pt says cbg varies from 65-275.  There is no trend throughout the day.  He started Ozempic yesterday.   Past Medical History:  Diagnosis Date  . Diabetes [facility](HCC)     Past Surgical History:  Procedure Laterality Date  . NOSE SURGERY      Social History   Socioeconomic History  . Marital status: Single    Spouse name: Not on file  . Number of children: Not on file  . Years of education: Not on file  . Highest education level: Not on file  Occupational History  . Not on file  Social Needs  . Financial resource strain: Not on file  . Food insecurity    Worry: Not on file    Inability: Not on file  . Transportation needs    Medical: Not on file    Non-medical: Not on file  Tobacco Use  . Smoking status: Former Games developermoker  . Smokeless tobacco: Never Used  Substance and Sexual Activity  . Alcohol use: Yes  . Drug use: Yes    Types: Marijuana  . Sexual activity: Not on  file  Lifestyle  . Physical activity    Days per week: Not on file    Minutes per session: Not on file  . Stress: Not on file  Relationships  . Social Musicianconnections    Talks on phone: Not on file    Gets together: Not on file    Attends religious service: Not on file    Active member of club or organization: Not on file    Attends meetings of clubs or organizations: Not on file    Relationship status: Not on file  . Intimate partner violence    Fear of current or ex partner: Not on file    Emotionally abused: Not on file    Physically abused: Not on file    Forced sexual activity: Not on file  Other Topics Concern  . Not on file  Social History Narrative  . Not on file    Current Outpatient Medications on File Prior to Visit  Medication Sig Dispense Refill  . Continuous Blood Gluc Receiver (DEXCOM G6 RECEIVER) [place] 1 each by Does not apply route See admin instructions. Use to monitor glucose levels; E10.9 1 Device 0  . Continuous Blood Gluc Sensor (DEXCOM G6 SENSOR) MISC 1 each by Does not apply route See admin  instructions. Change sensor every 10 days; E10.9 1 each 11  . Continuous Blood Gluc Transmit (DEXCOM G6 TRANSMITTER) MISC 1 each by Does not apply route See admin instructions. Use to monitor glucose levels; E10.9 1 each 3  . glucagon 1 MG injection Inject 1 mg into the muscle once as needed for up to 1 dose. 1 each 12  . Insulin Glargine (LANTUS SOLOSTAR) 100 UNIT/ML Solostar Pen ADMINISTER 30 UNITS UNDER THE SKIN TWICE DAILY (Patient taking differently: 32 Units. ADMINISTER 30 UNITS UNDER THE SKIN TWICE DAILY) 9 mL 0  . insulin lispro (HUMALOG) 100 UNIT/ML injection For use in insulin pump; Total of 90 units per day 30 mL 2  . Insulin Pen Needle (B-D UF III MINI PEN NEEDLES) 31G X 5 MM MISC Use to inject insulin daily 90 each 0  . meloxicam (MOBIC) 15 MG tablet Take 1 tablet (15 mg total) by mouth daily as needed. 10 tablet 0  . MICROLET LANCETS MISC USE 1 FOUR TIMES DAILY  100 each 0  . Semaglutide,0.25 or 0.5MG /DOS, (OZEMPIC, 0.25 OR 0.5 MG/DOSE,) 2 MG/1.5ML SOPN Inject 0.25 mg into the skin once a week. 1 pen 11   No current facility-administered medications on file prior to visit.     No Known Allergies  No family history on file.  Ht 6\' 2"  (1.88 m)   BMI 35.18 kg/m    Review of Systems He denies LOC    Objective:   Physical Exam      Assessment & Plan:  Insulin-requiring type 2 DM: he needs increased rx.  Hypoglycemia: this limits aggressiveness of glycemic control.    Patient Instructions  Please continue the same Ozempic. As you are on this, you may find this reduces your insulin needs.   Please come back for a follow-up appointment in 2 weeks. please continue these settings:  basal rate of 1.3 units/hr.  bolus of 1 unit/15 grams carbohydrate.   correction bolus (which some people call "sensitivity," or "insulin sensitivity ratio," or just "isr") of 1 unit for each 50 by which your glucose exceeds 120.

## 2019-03-11 DIAGNOSIS — R21 Rash and other nonspecific skin eruption: Secondary | ICD-10-CM | POA: Diagnosis not present

## 2019-04-08 NOTE — Telephone Encounter (Signed)
Opened in error

## 2019-06-09 ENCOUNTER — Other Ambulatory Visit: Payer: Self-pay

## 2019-06-09 DIAGNOSIS — E109 Type 1 diabetes mellitus without complications: Secondary | ICD-10-CM

## 2019-06-09 MED ORDER — INSULIN LISPRO 100 UNIT/ML ~~LOC~~ SOLN: SUBCUTANEOUS | 2 refills | Status: DC

## 2019-06-11 ENCOUNTER — Other Ambulatory Visit: Payer: Self-pay

## 2019-06-11 ENCOUNTER — Telehealth: Payer: Self-pay | Admitting: Endocrinology

## 2019-06-11 DIAGNOSIS — E109 Type 1 diabetes mellitus without complications: Secondary | ICD-10-CM

## 2019-06-11 MED ORDER — INSULIN ASPART 100 UNIT/ML ~~LOC~~ SOLN: SUBCUTANEOUS | 2 refills | Status: DC

## 2019-06-11 NOTE — Telephone Encounter (Signed)
Called pt to clarify test strips refill request. Advised Rx no longer on his medication list and would need to know the specific brand being requested. Pt stated he did not remember. Advised to call our office with the specific name of the test strip he is needing so the correct Rx could be sent. In addition, pt advised that a Rx for Humalog was sent 06/09/19 d/t insurance no longer covering Novolog. Pt insisted insurance does cover Novolog. Given the fact that the pharmacy sent the rejection for Novolog, advised I would call for further clarification from his pharmacist. Verbalized acceptance and understanding.  insulin lispro (HUMALOG) 100 UNIT/ML injection 30 mL 2 06/09/2019    Sig: For use in insulin pump; Total of 90 units per day   Sent to pharmacy as: insulin lispro (HUMALOG) 100 UNIT/ML injection   E-Prescribing Status: Receipt confirmed by pharmacy (06/09/2019  7:40 AM EST)    Spoke with pharmacist re: above Rx. Advised pharmacist, above Rx was sent d/t Novolog not being covered. However, after speaking with pt, it seems Humalog is no longer covered and needed a refill of Novolog. Asked for clarification. Pharmacist states the rejection was sent erroneously by one of their technicians. Further confirmed, Novolog IS covered by pt plan. Rx has been re-sent as indicated below:  insulin aspart (NOVOLOG) 100 UNIT/ML injection 30 mL 2 06/11/2019    Sig: For use in insulin pump; Total of 90 units per day   Sent to pharmacy as: insulin aspart (NOVOLOG) 100 UNIT/ML injection   E-Prescribing Status: Receipt confirmed by pharmacy (06/11/2019 11:43 AM EST)

## 2019-06-11 NOTE — Telephone Encounter (Signed)
MEDICATION:  Novolog vials AND Test Strips for meter  PHARMACY:   [id number] - [place], [address] AT [place] [phone] (Phone) [phone] (Fax)    IS THIS A 90 DAY SUPPLY : ?  IS PATIENT OUT OF MEDICATION: Almost  IF NOT; HOW MUCH IS LEFT: 1/2 vial  LAST APPOINTMENT DATE: @9 /08/2018  NEXT APPOINTMENT DATE:@12 /16/2020  DO WE HAVE YOUR PERMISSION TO LEAVE A DETAILED MESSAGE: Yes  OTHER COMMENTS:    **Let patient know to contact pharmacy at the end of the day to make sure medication is ready. **  ** Please notify patient to allow 48-72 hours to process**  **Encourage patient to contact the pharmacy for refills or they can request refills through [facility]**

## 2019-06-18 ENCOUNTER — Encounter: Payer: Self-pay | Admitting: Endocrinology

## 2019-06-18 ENCOUNTER — Other Ambulatory Visit: Payer: Self-pay

## 2019-06-18 ENCOUNTER — Encounter: Payer: BC Managed Care – PPO | Admitting: Endocrinology

## 2019-06-19 ENCOUNTER — Telehealth: Payer: Self-pay

## 2019-06-19 DIAGNOSIS — E109 Type 1 diabetes mellitus without complications: Secondary | ICD-10-CM | POA: Diagnosis not present

## 2019-06-19 DIAGNOSIS — Z794 Long term (current) use of insulin: Secondary | ICD-10-CM | POA: Diagnosis not present

## 2019-06-19 DIAGNOSIS — R21 Rash and other nonspecific skin eruption: Secondary | ICD-10-CM | POA: Diagnosis not present

## 2019-06-19 NOTE — Telephone Encounter (Signed)
LMTCB to schedule appointment °

## 2019-06-19 NOTE — Telephone Encounter (Signed)
Pt NS'd for 06/18/19 virtual visit. Received notification from [facility] requesting office notes and completion of [place]. Unable to complete forms without a current appt. Routing this message to the front desk for scheduling purposes.

## 2019-06-23 NOTE — Telephone Encounter (Signed)
LMTCB x 2 to schedule appointment °

## 2019-07-15 DIAGNOSIS — L400 Psoriasis vulgaris: Secondary | ICD-10-CM | POA: Diagnosis not present

## 2019-08-18 DIAGNOSIS — L400 Psoriasis vulgaris: Secondary | ICD-10-CM | POA: Diagnosis not present

## 2019-09-25 ENCOUNTER — Ambulatory Visit: Payer: BC Managed Care – PPO | Attending: Internal Medicine

## 2019-09-25 DIAGNOSIS — Z23 Encounter for immunization: Secondary | ICD-10-CM

## 2019-09-25 NOTE — Progress Notes (Signed)
   Covid-19 Vaccination Clinic  Name:  Trevor Dalton    MRN: [id number] DOB: 02/17/1986  09/25/2019  [name] was observed post Covid-19 immunization for 15 minutes without incident. He was provided with Vaccine Information Sheet and instruction to access the V-Safe system.   [name] was instructed to call 911 with any severe reactions post vaccine: Marland Kitchen Difficulty breathing  . Swelling of face and throat  . A fast heartbeat  . A bad rash all over body  . Dizziness and weakness   Immunizations Administered    Name Date Dose VIS Date Route   Pfizer COVID-19 Vaccine 09/25/2019  3:38 PM 0.3 mL 06/13/2019 Intramuscular   Manufacturer: [employer], [employer]   Lot: [id number]   NDC: [address]-2

## 2019-09-26 ENCOUNTER — Telehealth: Payer: Self-pay | Admitting: Endocrinology

## 2019-09-26 NOTE — Telephone Encounter (Signed)
1.  Please schedule f/u appt 2.  Then please refill x 1, pending that appt.  

## 2019-09-26 NOTE — Telephone Encounter (Signed)
MEDICATION: novolog  PHARMACY:   [facility] [phone] - [place], [place] - [address] [place] [address] AT [facility] [address] [place] [address] Phone:  [phone]  Fax:  [phone]     IS THIS A 90 DAY SUPPLY : "same as last time"  IS PATIENT OUT OF MEDICATION: yes  IF NOT; HOW MUCH IS LEFT:   LAST APPOINTMENT DATE: 03/05/19  NEXT APPOINTMENT DATE: no future appt  DO WE HAVE YOUR PERMISSION TO LEAVE A DETAILED MESSAGE: yes  OTHER COMMENTS:    **Let patient know to contact pharmacy at the end of the day to make sure medication is ready. **  ** Please notify patient to allow 48-72 hours to process**  **Encourage patient to contact the pharmacy for refills or they can request refills through [facility]**

## 2019-09-26 NOTE — Telephone Encounter (Signed)
Please advise 

## 2019-09-26 NOTE — Telephone Encounter (Signed)
LVM requesting returned call 

## 2019-10-01 ENCOUNTER — Encounter: Payer: Self-pay | Admitting: Endocrinology

## 2019-10-01 NOTE — Telephone Encounter (Signed)
ATC pt to set up appt-no answer-no voicemail pick up  [employer]

## 2019-10-20 ENCOUNTER — Ambulatory Visit: Payer: BC Managed Care – PPO | Attending: Internal Medicine

## 2019-10-20 DIAGNOSIS — Z23 Encounter for immunization: Secondary | ICD-10-CM

## 2019-10-20 NOTE — Progress Notes (Signed)
   Covid-19 Vaccination Clinic  Name:  Trevor Dalton    MRN: [id number] DOB: October 01, 1985  10/20/2019  [name] was observed post Covid-19 immunization for 15 minutes without incident. He was provided with Vaccine Information Sheet and instruction to access the V-Safe system.   Mr. Chance was instructed to call 911 with any severe reactions post vaccine: Marland Kitchen Difficulty breathing  . Swelling of face and throat  . A fast heartbeat  . A bad rash all over body  . Dizziness and weakness   Immunizations Administered    Name Date Dose VIS Date Route   Pfizer COVID-19 Vaccine 10/20/2019  4:11 PM 0.3 mL 08/27/2018 Intramuscular   Manufacturer: [employer], [employer]   Lot: [id number]   NDC: [address]-0

## 2019-10-30 ENCOUNTER — Encounter: Payer: Self-pay | Admitting: Endocrinology

## 2019-11-06 ENCOUNTER — Other Ambulatory Visit: Payer: Self-pay | Admitting: Endocrinology

## 2019-11-06 DIAGNOSIS — E109 Type 1 diabetes mellitus without complications: Secondary | ICD-10-CM

## 2019-11-10 ENCOUNTER — Other Ambulatory Visit: Payer: Self-pay

## 2019-11-10 DIAGNOSIS — E109 Type 1 diabetes mellitus without complications: Secondary | ICD-10-CM

## 2019-11-10 MED ORDER — GLUCAGON (RDNA) 1 MG IJ KIT: 1.0000 mg | PACK | Freq: Once | INTRAMUSCULAR | 0 refills | Status: DC | PRN

## 2019-11-10 MED ORDER — CONTOUR NEXT TEST VI STRP: 1.0000 | ORAL_STRIP | Freq: Four times a day (QID) | 0 refills | Status: DC

## 2019-11-10 MED ORDER — DEXCOM G6 SENSOR MISC: 1.0000 | 0 refills | Status: DC

## 2019-11-10 NOTE — Telephone Encounter (Signed)
Patient called and has schedulee appointment for Nov 26, 2019 at 3:45 PM.  In addition to needing Contour Next test strips, Glucagon, and Dexcom 6 sensor

## 2019-11-26 ENCOUNTER — Telehealth: Payer: Self-pay

## 2019-11-26 ENCOUNTER — Ambulatory Visit (INDEPENDENT_AMBULATORY_CARE_PROVIDER_SITE_OTHER): Payer: BC Managed Care – PPO | Admitting: Endocrinology

## 2019-11-26 ENCOUNTER — Encounter: Payer: Self-pay | Admitting: Endocrinology

## 2019-11-26 ENCOUNTER — Other Ambulatory Visit: Payer: Self-pay

## 2019-11-26 VITALS — BP 110/80 | HR 98 | Ht 74.0 in | Wt 263.0 lb

## 2019-11-26 DIAGNOSIS — E109 Type 1 diabetes mellitus without complications: Secondary | ICD-10-CM | POA: Diagnosis not present

## 2019-11-26 LAB — POCT GLYCOSYLATED HEMOGLOBIN (HGB A1C): Hemoglobin A1C: 6.8 % — AB (ref 4.0–5.6)

## 2019-11-26 MED ORDER — INSULIN ASPART 100 UNIT/ML ~~LOC~~ SOLN: SUBCUTANEOUS | 3 refills | Status: DC

## 2019-11-26 MED ORDER — CONTOUR NEXT TEST VI STRP: 1.0000 | ORAL_STRIP | Freq: Four times a day (QID) | 3 refills | Status: DC

## 2019-11-26 MED ORDER — DEXCOM G6 SENSOR MISC: 1.0000 | 3 refills | Status: DC

## 2019-11-26 NOTE — Telephone Encounter (Signed)
Per Dr. Everardo All, pt is wanting to conduct visits remotely. Dr. Everardo All requested the following for patient:   Needs assistance with pump settings as indicated in AVS DOS 11/26/19. Does not want to use "QUICK BOLUS"  Need to share pump data with office remotely  Tiffany Kocher, LPN left for the day. Routing this to [facility] for him to call pt to discuss.

## 2019-11-26 NOTE — Progress Notes (Signed)
Subjective:    Patient ID: Trevor Dalton, male    DOB: 1985/09/01, 34 y.o.   MRN: [id number]  HPI Pt returns for f/u of diabetes mellitus: DM type: Insulin-requiring type 2 Dx'ed: 7672 Complications: none Therapy: insulin since dx DKA: never Severe hypoglycemia: last episode was early 2019.  Pancreatitis: never.   Other: he takes multiple daily injections, he works 1st shift.  Interval history:  He started pump rx last week.  He takes these settings:   basal rate of 1.6 units/hr, except 2.6 units/hr MN-6AM.  bolus of 1 unit/15 grams carbohydrate.   correction bolus (which some people call "sensitivity," or "insulin sensitivity ratio," or just "isr") of 1 unit for each 50 by which your glucose exceeds 120.   I reviewed continuous glucose monitor data.  Glucose varies from 50-300.  It is in general higher as the day goes on, but not necessarily so.  He stopped Ozempic, due to nausea Past Medical History:  Diagnosis Date  . Diabetes [facility])     Past Surgical History:  Procedure Laterality Date  . NOSE SURGERY      Social History   Socioeconomic History  . Marital status: Single    Spouse name: Not on file  . Number of children: Not on file  . Years of education: Not on file  . Highest education level: Not on file  Occupational History  . Not on file  Tobacco Use  . Smoking status: Former Research scientist (life sciences)  . Smokeless tobacco: Never Used  Substance and Sexual Activity  . Alcohol use: Yes  . Drug use: Yes    Types: Marijuana  . Sexual activity: Not on file  Other Topics Concern  . Not on file  Social History Narrative  . Not on file   Social Determinants of Health   Financial Resource Strain:   . Difficulty of Paying Living Expenses:   Food Insecurity:   . Worried About Charity fundraiser in the Last Year:   . Arboriculturist in the Last Year:   Transportation Needs:   . Film/video editor (Medical):   Marland Kitchen Lack of Transportation (Non-Medical):   Physical Activity:    . Days of Exercise per Week:   . Minutes of Exercise per Session:   Stress:   . Feeling of Stress :   Social Connections:   . Frequency of Communication with Friends and Family:   . Frequency of Social Gatherings with Friends and Family:   . Attends Religious Services:   . Active Member of Clubs or Organizations:   . Attends Archivist Meetings:   Marland Kitchen Marital Status:   Intimate Partner Violence:   . Fear of Current or Ex-Partner:   . Emotionally Abused:   Marland Kitchen Physically Abused:   . Sexually Abused:     Current Outpatient Medications on File Prior to Visit  Medication Sig Dispense Refill  . Continuous Blood Gluc Receiver ([place]) [place] 1 each by Does not apply route See admin instructions. Use to monitor glucose levels; E10.9 1 Device 0  . Continuous Blood Gluc Transmit (DEXCOM G6 TRANSMITTER) MISC 1 each by Does not apply route See admin instructions. Use to monitor glucose levels; E10.9 1 each 3  . glucagon 1 MG injection Inject 1 mg into the muscle once as needed for up to 1 dose (HYPOGLYCEMIA). E11.9 1 each 0  . Insulin Pen Needle (B-D UF III MINI PEN NEEDLES) 31G X 5 MM MISC Use to  inject insulin daily 90 each 0  . meloxicam (MOBIC) 15 MG tablet Take 1 tablet (15 mg total) by mouth daily as needed. 10 tablet 0  . MICROLET LANCETS MISC USE 1 FOUR TIMES DAILY 100 each 0   No current facility-administered medications on file prior to visit.    No Known Allergies  No family history on file.  BP 110/80   Pulse 98   Ht 6\' 2"  (1.88 m)   Wt 263 lb (119.3 kg)   SpO2 94%   BMI 33.77 kg/m    Review of Systems He has lost a few lbs, due to his efforts.      Objective:   Physical Exam VITAL SIGNS:  See vs page GENERAL: no distress Pulses: dorsalis pedis intact bilat.   MSK: no deformity of the feet CV: no leg edema Skin:  no ulcer on the feet.  normal color and temp on the feet. Neuro: sensation is intact to touch on the feet  Lab Results  Component  Value Date   HGBA1C 6.8 (A) 11/26/2019       Assessment & Plan:  Insulin-requiring type 2 DM: The pattern of his cbg's indicates he needs some adjustment in his therapy Hypoglycemia, due to insulin: this limits aggressiveness of glycemic control Nausea: side effect of Ozempic.    Patient Instructions  please take these settings:  basal rate of 1.6 units/hr, 24 hrs per day. bolus of 1 unit/12 grams carbohydrate.   correction bolus (which some people call "sensitivity," or "insulin sensitivity ratio," or just "isr") of 1 unit for each 50 by which your glucose exceeds 120.   Please come back for a follow-up appointment in 3 months.

## 2019-11-26 NOTE — Patient Instructions (Addendum)
please take these settings:  basal rate of 1.6 units/hr, 24 hrs per day. bolus of 1 unit/12 grams carbohydrate.   correction bolus (which some people call "sensitivity," or "insulin sensitivity ratio," or just "isr") of 1 unit for each 50 by which your glucose exceeds 120.   Please come back for a follow-up appointment in 3 months.

## 2019-11-27 NOTE — Telephone Encounter (Signed)
Routing to CDE so she can assist in this matter.

## 2019-11-28 DIAGNOSIS — Z794 Long term (current) use of insulin: Secondary | ICD-10-CM | POA: Diagnosis not present

## 2019-11-28 DIAGNOSIS — E109 Type 1 diabetes mellitus without complications: Secondary | ICD-10-CM | POA: Diagnosis not present

## 2019-12-02 ENCOUNTER — Telehealth: Payer: Self-pay

## 2019-12-02 NOTE — Telephone Encounter (Signed)
FAXED DOCUMENTS  Company: [employer]  Document: DWO for insulin pump and supplies Other records requested: None requested  All above requested information has been faxed successfully to [employer] listed above. Documents and fax confirmation have been placed in the faxed file for future reference.

## 2019-12-03 NOTE — Telephone Encounter (Signed)
Please disregard previous note.  Was incorrect.  Tried calling patient but, voice mail is not set up, and no answer.

## 2019-12-09 ENCOUNTER — Telehealth: Payer: Self-pay | Admitting: Nutrition

## 2019-12-09 NOTE — Telephone Encounter (Signed)
Phoned X 2 .  No voice mail set up.  Calling to see if he needed help in making changes to his insulin pump settings.

## 2019-12-14 ENCOUNTER — Other Ambulatory Visit: Payer: Self-pay | Admitting: Endocrinology

## 2019-12-14 DIAGNOSIS — E109 Type 1 diabetes mellitus without complications: Secondary | ICD-10-CM

## 2019-12-27 ENCOUNTER — Other Ambulatory Visit: Payer: Self-pay | Admitting: Endocrinology

## 2019-12-27 DIAGNOSIS — E109 Type 1 diabetes mellitus without complications: Secondary | ICD-10-CM

## 2019-12-29 ENCOUNTER — Other Ambulatory Visit: Payer: Self-pay | Admitting: Endocrinology

## 2019-12-29 DIAGNOSIS — E109 Type 1 diabetes mellitus without complications: Secondary | ICD-10-CM

## 2020-02-26 ENCOUNTER — Telehealth: Payer: BC Managed Care – PPO | Admitting: Endocrinology

## 2020-02-26 ENCOUNTER — Other Ambulatory Visit: Payer: Self-pay

## 2020-03-15 DIAGNOSIS — Z794 Long term (current) use of insulin: Secondary | ICD-10-CM | POA: Diagnosis not present

## 2020-03-15 DIAGNOSIS — E1065 Type 1 diabetes mellitus with hyperglycemia: Secondary | ICD-10-CM | POA: Diagnosis not present

## 2020-03-15 DIAGNOSIS — E109 Type 1 diabetes mellitus without complications: Secondary | ICD-10-CM | POA: Diagnosis not present

## 2020-03-31 DIAGNOSIS — L0591 Pilonidal cyst without abscess: Secondary | ICD-10-CM | POA: Diagnosis not present

## 2020-05-17 DIAGNOSIS — Z20828 Contact with and (suspected) exposure to other viral communicable diseases: Secondary | ICD-10-CM | POA: Diagnosis not present

## 2020-08-25 DIAGNOSIS — Z794 Long term (current) use of insulin: Secondary | ICD-10-CM | POA: Diagnosis not present

## 2020-08-25 DIAGNOSIS — E1065 Type 1 diabetes mellitus with hyperglycemia: Secondary | ICD-10-CM | POA: Diagnosis not present

## 2020-08-25 DIAGNOSIS — E109 Type 1 diabetes mellitus without complications: Secondary | ICD-10-CM | POA: Diagnosis not present

## 2020-11-19 ENCOUNTER — Telehealth: Payer: Self-pay | Admitting: Endocrinology

## 2020-11-19 DIAGNOSIS — E109 Type 1 diabetes mellitus without complications: Secondary | ICD-10-CM

## 2020-11-19 MED ORDER — DEXCOM G6 TRANSMITTER MISC: 2 refills | Status: DC

## 2020-11-19 MED ORDER — DEXCOM G6 SENSOR MISC: 1.0000 | 3 refills | Status: DC

## 2020-11-19 NOTE — Telephone Encounter (Signed)
Refill sent.

## 2020-11-19 NOTE — Telephone Encounter (Signed)
Appointment Was Made  MEDICATION: Dexcom G6 Sensor & Transmitter  PHARMACY:  [facility] on [address] in [place]  HAS THE PATIENT CONTACTED THEIR PHARMACY?  Yes - no fills left advised to call us   IS THIS A 90 DAY SUPPLY :   IS PATIENT OUT OF MEDICATION: will be as of tomorrow  IF NOT; HOW MUCH IS LEFT:  0  LAST APPOINTMENT DATE: @05 /26/2022  NEXT APPOINTMENT DATE:@6 /07/2020  DO WE HAVE YOUR PERMISSION TO LEAVE A DETAILED MESSAGE?: yes  OTHER COMMENTS:

## 2020-12-01 ENCOUNTER — Other Ambulatory Visit: Payer: Self-pay

## 2020-12-01 ENCOUNTER — Other Ambulatory Visit: Payer: Self-pay | Admitting: Endocrinology

## 2020-12-01 ENCOUNTER — Ambulatory Visit: Payer: BC Managed Care – PPO | Admitting: Endocrinology

## 2020-12-01 VITALS — BP 136/76 | HR 78 | Ht 74.0 in | Wt 259.0 lb

## 2020-12-01 DIAGNOSIS — E109 Type 1 diabetes mellitus without complications: Secondary | ICD-10-CM | POA: Diagnosis not present

## 2020-12-01 LAB — POCT GLYCOSYLATED HEMOGLOBIN (HGB A1C): Hemoglobin A1C: 6.6 % — AB (ref 4.0–5.6)

## 2020-12-01 MED ORDER — INSULIN ASPART 100 UNIT/ML IJ SOLN: INTRAMUSCULAR | 99 refills | Status: DC

## 2020-12-01 MED ORDER — INSULIN INFUS PUMP RESERVOIR MISC: 1.0000 | 3 refills | Status: DC

## 2020-12-01 MED ORDER — "INFUSION SET 23"" 8MM MISC": 1.0000 | 3 refills | Status: DC

## 2020-12-01 MED ORDER — DEXCOM G6 TRANSMITTER MISC: 2 refills | Status: DC

## 2020-12-01 MED ORDER — DEXCOM G6 RECEIVER DEVI: 1.0000 | 3 refills | Status: DC

## 2020-12-01 MED ORDER — GLUCAGON (RDNA) 1 MG IJ KIT: 1.0000 mg | PACK | Freq: Once | INTRAMUSCULAR | 0 refills | Status: DC | PRN

## 2020-12-01 NOTE — Patient Instructions (Addendum)
please take these settings:  basal rate of 1.6 units/hr, 24 hrs per day. bolus of 1 unit/20 grams carbohydrate at breakfast, and 1 unit/10 grams at all other times of day.  correction bolus (which some people call "sensitivity," or "insulin sensitivity ratio," or just "isr") of 1 unit for each 50 by which your glucose exceeds 120.   Please come back for a follow-up appointment in 3 months.

## 2020-12-01 NOTE — Progress Notes (Signed)
HPI Pt returns for f/u of diabetes mellitus: DM type: Insulin-requiring type 2 Dx'ed: 1992 Complications: none Therapy: insulin since dx DKA: never Severe hypoglycemia: last episode was early 2019.  Pancreatitis: never.   Other: he works 1st shift.  Interval history:  He started pump rx last week.  He takes these settings:   basal rate of 1.6 units/hr, except 2.1 units/HR, 1AM-6AM (when not in "control-IQ."  bolus of 1 unit/15 grams carbohydrate.   correction bolus (which some people call "sensitivity," or "insulin sensitivity ratio," or just "isr") of 1 unit for each 50 by which your glucose exceeds 120.   I reviewed continuous glucose monitor data.  Glucose varies from 80-255.  It is in general highest at 3PM and 10PM  He stopped Ozempic, due to nausea.   He is in "control-IQ" mode 97% of the time.   TDD is 68 units (58% basal) He has mild hypoglycemia approx twice per week.  This happens most commonly after breakfast Past Medical History:  Diagnosis Date  . Diabetes [facility])     Past Surgical History:  Procedure Laterality Date  . NOSE SURGERY      Social History   Socioeconomic History  . Marital status: Single    Spouse name: Not on file  . Number of children: Not on file  . Years of education: Not on file  . Highest education level: Not on file  Occupational History  . Not on file  Tobacco Use  . Smoking status: Former Games developer  . Smokeless tobacco: Never Used  Substance and Sexual Activity  . Alcohol use: Yes  . Drug use: Yes    Types: Marijuana  . Sexual activity: Not on file  Other Topics Concern  . Not on file  Social History Narrative  . Not on file   Social Determinants of Health   Financial Resource Strain: Not on file  Food Insecurity: Not on file  Transportation Needs: Not on file  Physical Activity: Not on file  Stress: Not on file  Social Connections: Not on file  Intimate Partner Violence: Not on file    Current Outpatient Medications on  File Prior to Visit  Medication Sig Dispense Refill  . Continuous Blood Gluc Sensor (DEXCOM G6 SENSOR) MISC 1 each by Does not apply route See admin instructions. Change every 10 days 9 each 3  . CONTOUR NEXT TEST test strip TEST FOUR TIMES DAILY AS DIRECTED 200 strip 3  . meloxicam (MOBIC) 15 MG tablet Take 1 tablet (15 mg total) by mouth daily as needed. 10 tablet 0  . MICROLET LANCETS MISC USE 1 FOUR TIMES DAILY 100 each 0   No current facility-administered medications on file prior to visit.    No Known Allergies  No family history on file.  BP 136/76 (BP Location: Right Arm, Patient Position: Sitting, Cuff Size: Large)   Pulse 78   Ht 6\' 2"  (1.88 m)   Wt 259 lb (117.5 kg)   SpO2 97%   BMI 33.25 kg/m    Review of Systems Denies LOC    Objective:  VITAL SIGNS:  See vs page GENERAL: no distress Pulses: dorsalis pedis intact bilat.   MSK: no deformity of the feet CV: no leg edema Skin:  no ulcer on the feet.  normal color and temp on the feet. Neuro: sensation is intact to touch on the feet.     Lab Results  Component Value Date   HGBA1C 6.6 (A) 12/01/2020  Assessment & Plan:  Insulin-requiring type 2 DM. Hypoglycemia, due to insulin: The pattern of his cbg's indicates he needs some adjustment in his therapy.    Patient Instructions  please take these settings:  basal rate of 1.6 units/hr, 24 hrs per day. bolus of 1 unit/20 grams carbohydrate at breakfast, and 1 unit/10 grams at all other times of day.  correction bolus (which some people call "sensitivity," or "insulin sensitivity ratio," or just "isr") of 1 unit for each 50 by which your glucose exceeds 120.   Please come back for a follow-up appointment in 3 months.

## 2021-01-17 DIAGNOSIS — Z794 Long term (current) use of insulin: Secondary | ICD-10-CM | POA: Diagnosis not present

## 2021-01-17 DIAGNOSIS — E1065 Type 1 diabetes mellitus with hyperglycemia: Secondary | ICD-10-CM | POA: Diagnosis not present

## 2021-01-17 DIAGNOSIS — E109 Type 1 diabetes mellitus without complications: Secondary | ICD-10-CM | POA: Diagnosis not present

## 2021-03-03 ENCOUNTER — Ambulatory Visit: Payer: BC Managed Care – PPO | Admitting: Endocrinology

## 2021-04-09 ENCOUNTER — Other Ambulatory Visit: Payer: Self-pay | Admitting: Endocrinology

## 2021-04-09 DIAGNOSIS — E109 Type 1 diabetes mellitus without complications: Secondary | ICD-10-CM

## 2021-04-18 ENCOUNTER — Ambulatory Visit: Payer: BC Managed Care – PPO | Admitting: Endocrinology

## 2021-04-18 ENCOUNTER — Other Ambulatory Visit: Payer: Self-pay

## 2021-04-18 ENCOUNTER — Encounter: Payer: Self-pay | Admitting: Endocrinology

## 2021-04-18 VITALS — BP 110/64 | HR 86 | Ht 74.0 in | Wt 258.2 lb

## 2021-04-18 DIAGNOSIS — E119 Type 2 diabetes mellitus without complications: Secondary | ICD-10-CM

## 2021-04-18 DIAGNOSIS — E109 Type 1 diabetes mellitus without complications: Secondary | ICD-10-CM

## 2021-04-18 LAB — POCT GLYCOSYLATED HEMOGLOBIN (HGB A1C): Hemoglobin A1C: 6.7 % — AB (ref 4.0–5.6)

## 2021-04-18 MED ORDER — GLUCAGON (RDNA) 1 MG IJ KIT: 1.0000 mg | PACK | Freq: Once | INTRAMUSCULAR | 0 refills | Status: AC | PRN

## 2021-04-18 NOTE — Progress Notes (Signed)
Subjective:    Patient ID: Trevor Dalton, male    DOB: 23-Mar-1986, 35 y.o.   MRN: [id number]  HPI Pt returns for f/u of diabetes mellitus: DM type: Insulin-requiring type 2 (but psoriasis suggests type 1) Dx'ed: 1992 Complications: none Therapy: insulin since dx (pump rx since 2022--Tandem, and dexcom CGM) DKA: never Severe hypoglycemia: last episode was early 2019.  Pancreatitis: never.   Other: he works 1st shift; he eats meals at [employer], 12N, and 7PM; he did not tolerate Ozempic (GI sxs) Interval history: He takes these settings:   basal rate of 1.6 units/hr, 24 hrs per day except 2.1 units/HR, 1AM-6AM (when not in "control-IQ.") bolus of 1 unit/15 grams carbohydrate at breakfast, and 1 unit/10 grams at all other times of day.  correction bolus (which some people call "sensitivity," or "insulin sensitivity ratio," or just "isr") of 1 unit for each 50 by which your glucose exceeds 120.   I reviewed continuous glucose monitor data.  Glucose varies from 50-361.  It is in general highest at 1-3PM and 10PM; It is in general lowest at 7AM and 6PM.  It decreases overnight. He is in "control-IQ" mode 95% of the time.   TDD is 75 units (51% basal) He has mild hypoglycemia approx twice per week.  This happens most commonly in the afternoon.   Past Medical History:  Diagnosis Date   Diabetes [facility])     Past Surgical History:  Procedure Laterality Date   NOSE SURGERY      Social History   Socioeconomic History   Marital status: Single    Spouse name: Not on file   Number of children: Not on file   Years of education: Not on file   Highest education level: Not on file  Occupational History   Not on file  Tobacco Use   Smoking status: Former   Smokeless tobacco: Never  Substance and Sexual Activity   Alcohol use: Yes   Drug use: Yes    Types: Marijuana   Sexual activity: Not on file  Other Topics Concern   Not on file  Social History Narrative   Not on file   Social  Determinants of Health   Financial Resource Strain: Not on file  Food Insecurity: Not on file  Transportation Needs: Not on file  Physical Activity: Not on file  Stress: Not on file  Social Connections: Not on file  Intimate Partner Violence: Not on file    Current Outpatient Medications on File Prior to Visit  Medication Sig Dispense Refill   Continuous Blood Gluc Receiver (DEXCOM G6 RECEIVER) [place] 1 each by Does not apply route See admin instructions. Change every 10 days 9 each 3   Continuous Blood Gluc Sensor (DEXCOM G6 SENSOR) MISC 1 each by Does not apply route See admin instructions. Change every 10 days 9 each 3   Continuous Blood Gluc Transmit (DEXCOM G6 TRANSMITTER) MISC USE TO MONITOR GLUCOSE LEVELS AS DIRECTED 1 each 2   CONTOUR NEXT TEST test strip TEST FOUR TIMES DAILY AS DIRECTED 200 strip 3   Insulin Infusion Pump Supplies (INSULIN INFUS PUMP RESERVOIR) MISC 1 Device by Does not apply route every 3 (three) days. 30 each 3   IV Sets-Tubing (INFUSION SET 23" ) MISC 1 Device by Does not apply route every 3 (three) days. 30 each 3   meloxicam (MOBIC) 15 MG tablet Take 1 tablet (15 mg total) by mouth daily as needed. 10 tablet 0   MICROLET LANCETS  MISC USE 1 FOUR TIMES DAILY 100 each 0   NOVOLOG 100 UNIT/ML injection USE IN UNSULIN PUMP UP TO 90 UNITS A DAY 90 mL PRN   No current facility-administered medications on file prior to visit.    No Known Allergies  No family history on file.  BP 110/64 (BP Location: Right Arm, Patient Position: Sitting, Cuff Size: Large)   Pulse 86   Ht 6\' 2"  (1.88 m)   Wt 258 lb 3.2 oz (117.1 kg)   SpO2 97%   BMI 33.15 kg/m    Review of Systems     Objective:   Physical Exam Pulses: dorsalis pedis intact bilat.   MSK: no deformity of the feet CV: no leg edema Skin:  no ulcer on the feet.  normal color and temp on the feet. Neuro: sensation is intact to touch on the feet   Lab Results  Component Value Date   HGBA1C 6.7 (A)  04/18/2021      Assessment & Plan:  Insulin-requiring type 2 DM Hypoglycemia, due to insulin: we'll increase mealtime bolus, to reduce control-IQ basal amount.    Patient Instructions  please take these settings:  basal rate of 1.6 units/hr, 24 hrs per day (when not in control-IQ mode) bolus of 1 unit/14 grams carbohydrate at breakfast, and 1 unit/10 grams at all other times of day.  correction bolus (which some people call "sensitivity," or "insulin sensitivity ratio," or just "isr") of 1 unit for each 50 by which your glucose exceeds 120.   Please come back for a follow-up appointment in 4 months.

## 2021-04-18 NOTE — Patient Instructions (Addendum)
please take these settings:  basal rate of 1.6 units/hr, 24 hrs per day (when not in control-IQ mode) bolus of 1 unit/14 grams carbohydrate at breakfast, and 1 unit/10 grams at all other times of day.  correction bolus (which some people call "sensitivity," or "insulin sensitivity ratio," or just "isr") of 1 unit for each 50 by which your glucose exceeds 120.   Please come back for a follow-up appointment in 4 months.

## 2021-05-24 DIAGNOSIS — E1065 Type 1 diabetes mellitus with hyperglycemia: Secondary | ICD-10-CM | POA: Diagnosis not present

## 2021-05-24 DIAGNOSIS — Z794 Long term (current) use of insulin: Secondary | ICD-10-CM | POA: Diagnosis not present

## 2021-05-24 DIAGNOSIS — E109 Type 1 diabetes mellitus without complications: Secondary | ICD-10-CM | POA: Diagnosis not present

## 2021-08-19 ENCOUNTER — Other Ambulatory Visit: Payer: Self-pay

## 2021-08-19 ENCOUNTER — Ambulatory Visit: Payer: BC Managed Care – PPO | Admitting: Endocrinology

## 2021-08-19 VITALS — BP 140/80 | HR 106 | Ht 74.0 in | Wt 262.0 lb

## 2021-08-19 DIAGNOSIS — E119 Type 2 diabetes mellitus without complications: Secondary | ICD-10-CM

## 2021-08-19 DIAGNOSIS — E109 Type 1 diabetes mellitus without complications: Secondary | ICD-10-CM

## 2021-08-19 LAB — POCT GLYCOSYLATED HEMOGLOBIN (HGB A1C): Hemoglobin A1C: 6.3 % — AB (ref 4.0–5.6)

## 2021-08-19 MED ORDER — INSULIN ASPART 100 UNIT/ML IJ SOLN: INTRAMUSCULAR | 99 refills | Status: DC

## 2021-08-19 MED ORDER — INSULIN INFUS PUMP RESERVOIR MISC: 1.0000 | 3 refills | Status: DC

## 2021-08-19 MED ORDER — "INFUSION SET 23"" 8MM MISC": 1.0000 | 3 refills | Status: DC

## 2021-08-19 MED ORDER — DEXCOM G6 SENSOR MISC: 1.0000 | 3 refills | Status: DC

## 2021-08-19 MED ORDER — CONTOUR NEXT TEST VI STRP: ORAL_STRIP | 3 refills | Status: AC

## 2021-08-19 MED ORDER — DEXCOM G6 TRANSMITTER MISC: 2 refills | Status: DC

## 2021-08-19 NOTE — Patient Instructions (Addendum)
please take these settings:  basal rate of 1.6 units/hr, 24 hrs per day (when not in control-IQ mode) bolus of 1 unit/14 grams carbohydrate.  correction bolus (which some people call "sensitivity," or "insulin sensitivity ratio," or just "isr") of 1 unit for each 50 by which your glucose exceeds 110.   The pump printout does seem like what you are getting.  Please see or talk to [facility], to see why this is.   Please come back for a follow-up appointment in 2-3 months.

## 2021-08-19 NOTE — Progress Notes (Signed)
Subjective:    Patient ID: Trevor Dalton, male    DOB: 08/08/1985, 36 y.o.   MRN: [id number]  HPI Pt returns for f/u of diabetes mellitus: DM type: Insulin-requiring type 2 (but psoriasis suggests type 1) Dx'ed: 1992 Complications: none Therapy: insulin since dx (pump rx since 2022--Tandem, and dexcom CGM) DKA: never Severe hypoglycemia: last episode was early 2019.  Pancreatitis: never.   SDOH: he lives in [place] and works on [employer].   Other: he works 1st shift; he eats meals at [employer], 12N, and 7PM; he did not tolerate Ozempic (GI sxs) Interval history: He takes these settings:   basal rate of 1.6 units/hr, except for 2.1 units/HR, 1AM-6AM (when not in control-IQ mode).  bolus of 1 unit/15 grams carbohydrate correction bolus (which some people call "sensitivity," or "insulin sensitivity ratio," or just "isr") of 1 unit for each 50 by which your glucose exceeds 120.   I reviewed continuous glucose monitor data.  Glucose varies from 60-310.  It is in general highest at 1PM and 11PM; It is in general lowest at 8-11AM and 7PM.  It decreases overnight.   He is in "control-IQ" mode just 6% of the time.   TDD is 6 units (53% basal).  Pt says actual number is more like 80.   He has mild hypoglycemia approx twice per week.  This still happens most commonly in the afternoon.   Past Medical History:  Diagnosis Date   Diabetes [facility])     Past Surgical History:  Procedure Laterality Date   NOSE SURGERY      Social History   Socioeconomic History   Marital status: Single    Spouse name: Not on file   Number of children: Not on file   Years of education: Not on file   Highest education level: Not on file  Occupational History   Not on file  Tobacco Use   Smoking status: Former   Smokeless tobacco: Never  Substance and Sexual Activity   Alcohol use: Yes   Drug use: Yes    Types: Marijuana   Sexual activity: Not on file  Other Topics Concern   Not on file  Social History  Narrative   Not on file   Social Determinants of Health   Financial Resource Strain: Not on file  Food Insecurity: Not on file  Transportation Needs: Not on file  Physical Activity: Not on file  Stress: Not on file  Social Connections: Not on file  Intimate Partner Violence: Not on file    Current Outpatient Medications on File Prior to Visit  Medication Sig Dispense Refill   Continuous Blood Gluc Receiver (DEXCOM G6 RECEIVER) DEVI 1 each by Does not apply route See admin instructions. Change every 10 days 9 each 3   glucagon 1 MG injection Inject 1 mg into the muscle once as needed for up to 1 dose (HYPOGLYCEMIA). E11.9 1 each 0   meloxicam (MOBIC) 15 MG tablet Take 1 tablet (15 mg total) by mouth daily as needed. 10 tablet 0   MICROLET LANCETS MISC USE 1 FOUR TIMES DAILY 100 each 0   No current facility-administered medications on file prior to visit.    No Known Allergies  No family history on file.  BP 140/80    Pulse (!) 106    Ht 6\' 2"  (1.88 m)    Wt 262 lb (118.8 kg)    SpO2 96%    BMI 33.64 kg/m    Review of  Systems     Objective:   Physical Exam    A1c=6.3%    Assessment & Plan:  Insulin-requiring type 2 DM: overcontrolled Hypoglycemia, due to insulin Device malfunction.  uncertain etiology.  Patient Instructions  please take these settings:  basal rate of 1.6 units/hr, 24 hrs per day (when not in control-IQ mode) bolus of 1 unit/14 grams carbohydrate.  correction bolus (which some people call "sensitivity," or "insulin sensitivity ratio," or just "isr") of 1 unit for each 50 by which your glucose exceeds 110.   The pump printout does seem like what you are getting.  Please see or talk to [facility], to see why this is.   Please come back for a follow-up appointment in 2-3 months.

## 2021-08-25 DIAGNOSIS — L03312 Cellulitis of back [any part except buttock]: Secondary | ICD-10-CM | POA: Diagnosis not present

## 2021-08-25 DIAGNOSIS — Z794 Long term (current) use of insulin: Secondary | ICD-10-CM | POA: Diagnosis not present

## 2021-08-25 DIAGNOSIS — E119 Type 2 diabetes mellitus without complications: Secondary | ICD-10-CM | POA: Diagnosis not present

## 2021-08-25 DIAGNOSIS — D72829 Elevated white blood cell count, unspecified: Secondary | ICD-10-CM | POA: Diagnosis not present

## 2021-08-25 DIAGNOSIS — R509 Fever, unspecified: Secondary | ICD-10-CM | POA: Diagnosis not present

## 2021-08-25 DIAGNOSIS — Z9641 Presence of insulin pump (external) (internal): Secondary | ICD-10-CM | POA: Diagnosis not present

## 2021-08-25 DIAGNOSIS — L729 Follicular cyst of the skin and subcutaneous tissue, unspecified: Secondary | ICD-10-CM | POA: Diagnosis not present

## 2021-08-25 DIAGNOSIS — K603 Anal fistula: Secondary | ICD-10-CM | POA: Diagnosis not present

## 2021-08-25 DIAGNOSIS — L0591 Pilonidal cyst without abscess: Secondary | ICD-10-CM | POA: Diagnosis not present

## 2021-08-25 DIAGNOSIS — Z6833 Body mass index (BMI) 33.0-33.9, adult: Secondary | ICD-10-CM | POA: Diagnosis not present

## 2021-08-25 DIAGNOSIS — R000 Tachycardia, unspecified: Secondary | ICD-10-CM | POA: Diagnosis not present

## 2021-08-25 DIAGNOSIS — R21 Rash and other nonspecific skin eruption: Secondary | ICD-10-CM | POA: Diagnosis not present

## 2021-08-25 DIAGNOSIS — L0501 Pilonidal cyst with abscess: Secondary | ICD-10-CM | POA: Diagnosis not present

## 2021-09-29 DIAGNOSIS — E109 Type 1 diabetes mellitus without complications: Secondary | ICD-10-CM | POA: Diagnosis not present

## 2021-09-29 DIAGNOSIS — Z794 Long term (current) use of insulin: Secondary | ICD-10-CM | POA: Diagnosis not present

## 2021-09-29 DIAGNOSIS — E1065 Type 1 diabetes mellitus with hyperglycemia: Secondary | ICD-10-CM | POA: Diagnosis not present

## 2021-10-25 DIAGNOSIS — E109 Type 1 diabetes mellitus without complications: Secondary | ICD-10-CM | POA: Diagnosis not present

## 2021-10-25 DIAGNOSIS — E1065 Type 1 diabetes mellitus with hyperglycemia: Secondary | ICD-10-CM | POA: Diagnosis not present

## 2021-10-25 DIAGNOSIS — Z794 Long term (current) use of insulin: Secondary | ICD-10-CM | POA: Diagnosis not present

## 2021-12-01 DIAGNOSIS — Z7189 Other specified counseling: Secondary | ICD-10-CM | POA: Diagnosis not present

## 2021-12-09 ENCOUNTER — Encounter: Payer: Self-pay | Admitting: Endocrinology

## 2021-12-31 DIAGNOSIS — Z7189 Other specified counseling: Secondary | ICD-10-CM | POA: Diagnosis not present

## 2022-01-31 DIAGNOSIS — Z7189 Other specified counseling: Secondary | ICD-10-CM | POA: Diagnosis not present

## 2022-02-14 DIAGNOSIS — E109 Type 1 diabetes mellitus without complications: Secondary | ICD-10-CM | POA: Diagnosis not present

## 2022-02-14 DIAGNOSIS — E1065 Type 1 diabetes mellitus with hyperglycemia: Secondary | ICD-10-CM | POA: Diagnosis not present

## 2022-02-14 DIAGNOSIS — Z794 Long term (current) use of insulin: Secondary | ICD-10-CM | POA: Diagnosis not present

## 2022-03-03 DIAGNOSIS — Z7189 Other specified counseling: Secondary | ICD-10-CM | POA: Diagnosis not present

## 2022-07-03 DIAGNOSIS — Z7189 Other specified counseling: Secondary | ICD-10-CM | POA: Diagnosis not present

## 2022-07-10 DIAGNOSIS — E1065 Type 1 diabetes mellitus with hyperglycemia: Secondary | ICD-10-CM | POA: Diagnosis not present

## 2022-07-10 DIAGNOSIS — E109 Type 1 diabetes mellitus without complications: Secondary | ICD-10-CM | POA: Diagnosis not present

## 2022-07-10 DIAGNOSIS — Z794 Long term (current) use of insulin: Secondary | ICD-10-CM | POA: Diagnosis not present

## 2022-08-03 DIAGNOSIS — Z7189 Other specified counseling: Secondary | ICD-10-CM | POA: Diagnosis not present

## 2022-08-15 DIAGNOSIS — E1065 Type 1 diabetes mellitus with hyperglycemia: Secondary | ICD-10-CM | POA: Diagnosis not present

## 2022-08-15 DIAGNOSIS — E109 Type 1 diabetes mellitus without complications: Secondary | ICD-10-CM | POA: Diagnosis not present

## 2022-08-15 DIAGNOSIS — Z794 Long term (current) use of insulin: Secondary | ICD-10-CM | POA: Diagnosis not present

## 2022-09-01 DIAGNOSIS — Z7189 Other specified counseling: Secondary | ICD-10-CM | POA: Diagnosis not present

## 2022-09-28 ENCOUNTER — Other Ambulatory Visit: Payer: Self-pay

## 2022-09-28 ENCOUNTER — Telehealth: Payer: Self-pay | Admitting: Internal Medicine

## 2022-09-28 DIAGNOSIS — E109 Type 1 diabetes mellitus without complications: Secondary | ICD-10-CM

## 2022-09-28 MED ORDER — DEXCOM G6 SENSOR MISC: 1.0000 | 0 refills | Status: DC

## 2022-09-28 MED ORDER — DEXCOM G6 TRANSMITTER MISC: 0 refills | Status: DC

## 2022-09-28 NOTE — Telephone Encounter (Signed)
MEDICATION:  Dexcom G6 Transmitter Continuous Blood Gluc Transmit (DEXCOM G6 TRANSMITTER) MISC  PHARMACY:    [id number] - [place], [place] - [address] (Ph: [phone])    HAS THE PATIENT CONTACTED THEIR PHARMACY?  Yes  IS THIS A 90 DAY SUPPLY : Yes  IS PATIENT OUT OF MEDICATION: No  IF NOT; HOW MUCH IS LEFT: Will be out Sunday 10/01/2022  LAST APPOINTMENT DATE: @02 /17/2023  NEXT APPOINTMENT DATE:@4 /09/2022  DO WE HAVE YOUR PERMISSION TO LEAVE A DETAILED MESSAGE?: Yes  OTHER COMMENTS:    **Let patient know to contact pharmacy at the end of the day to make sure medication is ready. **  ** Please notify patient to allow 48-72 hours to process**  **Encourage patient to contact the pharmacy for refills or they can request refills through [facility]**

## 2022-09-28 NOTE — Telephone Encounter (Signed)
done

## 2022-10-02 DIAGNOSIS — Z7189 Other specified counseling: Secondary | ICD-10-CM | POA: Diagnosis not present

## 2022-10-04 ENCOUNTER — Ambulatory Visit: Payer: BC Managed Care – PPO | Admitting: Internal Medicine

## 2022-10-04 ENCOUNTER — Encounter: Payer: Self-pay | Admitting: Internal Medicine

## 2022-10-04 VITALS — BP 122/86 | HR 102 | Ht 74.0 in | Wt 259.0 lb

## 2022-10-04 DIAGNOSIS — E785 Hyperlipidemia, unspecified: Secondary | ICD-10-CM

## 2022-10-04 DIAGNOSIS — E109 Type 1 diabetes mellitus without complications: Secondary | ICD-10-CM

## 2022-10-04 LAB — BASIC METABOLIC PANEL
BUN: 12 mg/dL (ref 6–23)
CO2: 29 mEq/L (ref 19–32)
Calcium: 9.5 mg/dL (ref 8.4–10.5)
Chloride: 102 mEq/L (ref 96–112)
Creatinine, Ser: 0.85 mg/dL (ref 0.40–1.50)
GFR: 113.06 mL/min (ref >60.00)
Glucose, Bld: 160 mg/dL — ABNORMAL HIGH (ref 70–99)
Potassium: 3.7 mEq/L (ref 3.5–5.1)
Sodium: 137 mEq/L (ref 135–145)

## 2022-10-04 LAB — LIPID PANEL
Cholesterol: 194 mg/dL (ref 0–200)
HDL: 52.8 mg/dL (ref >39.00)
LDL Cholesterol: 123 mg/dL — ABNORMAL HIGH (ref 0–99)
NonHDL: 141.07
Total CHOL/HDL Ratio: 4
Triglycerides: 91 mg/dL (ref 0.0–149.0)
VLDL: 18.2 mg/dL (ref 0.0–40.0)

## 2022-10-04 LAB — TSH: TSH: 2.45 u[IU]/mL (ref 0.35–5.50)

## 2022-10-04 LAB — POCT GLYCOSYLATED HEMOGLOBIN (HGB A1C): Hemoglobin A1C: 6.5 % — AB (ref 4.0–5.6)

## 2022-10-04 MED ORDER — DEXCOM G7 SENSOR MISC: 1.0000 | 3 refills | Status: DC

## 2022-10-04 MED ORDER — INSULIN ASPART 100 UNIT/ML IJ SOLN: INTRAMUSCULAR | 3 refills | Status: DC

## 2022-10-04 NOTE — Patient Instructions (Signed)
Please contact Trevor Dalton [phone]    - HOW TO TREAT LOW BLOOD SUGARS (Blood sugar LESS THAN 70 MG/DL) Please follow the RULE OF 15 for the treatment of hypoglycemia treatment (when your (blood sugars are less than 70 mg/dL)   STEP 1: Take 15 grams of carbohydrates when your blood sugar is low, which includes:  3-4 GLUCOSE TABS  OR 3-4 OZ OF JUICE OR REGULAR SODA OR ONE TUBE OF GLUCOSE GEL    STEP 2: RECHECK blood sugar in 15 MINUTES STEP 3: If your blood sugar is still low at the 15 minute recheck --> then, go back to STEP 1 and treat AGAIN with another 15 grams of carbohydrates.

## 2022-10-04 NOTE — Progress Notes (Unsigned)
Name: Trevor Dalton  Age/ Sex: 37 y.o., male   MRN/ DOB: [id number], [birth year]     PCP: [facility]   Reason for Endocrinology Evaluation: Type 1 Diabetes Mellitus  Initial Endocrine Consultative Visit: 09/10/2013    PATIENT IDENTIFIER: Trevor Dalton is a 37 y.o. male with a past medical history of DM. The patient has followed with Endocrinology clinic since 09/10/2013 for consultative assistance with management of his diabetes.  DIABETIC HISTORY:  Trevor Dalton was diagnosed with DM 1992 at age 39, he has been on insulin pump since 2022 ( tandem) , he was unable to tolerate Ozempic . His hemoglobin A1c has ranged from 6.3% in 2023, peaking at 9.0% in 2016.   He was followed by Dr. Loanne Drilling 2015 until 08/2021    SUBJECTIVE:   During the last visit (08/19/2021): Saw Dr. Loanne Drilling   Today (10/04/2022): Trevor Dalton is here for a follow up on diabetes management.  He checks his blood sugars multiple times daily. The patient has  had hypoglycemic episodes since the last clinic visit, which typically occur during the day. The patient is  symptomatic with these episodes   Denies nausea, vomiting or diarrhea     HOME DIABETES REGIMEN:  Novolog    Statin: no ACE-I/ARB: no Prior Diabetic Education: yes   This patient with type 1 diabetes is treated with Tandem  (insulin pump). During the visit the pump basal and bolus doses were reviewed including carb/insulin rations and supplemental doses. The clinical list was updated. The glucose meter download was reviewed in detail to determine if the current pump settings are providing the best glycemic control without excessive hypoglycemia.    Pump and meter download:    Pump   Tandem  Settings   Insulin type   Novolog    Basal rate       0000 1.9u/h    0600 1.7   1815 1.8      I:C ratio       0000 1:12                  Sensitivity       0000  45      Goal       0000  110            Type & Model of  Pump: Tandem  Insulin Type: Currently using Novlog .  Body mass index is 33.25 kg/m.  PUMP STATISTICS: Average BG: 158  Average Daily Carbs (g): 10  Average Total Daily Insulin: 86.2 Average Daily Basal: 41.67 (48 %) Average Daily Bolus: 44.52 (52 %)    CONTINUOUS GLUCOSE MONITORING RECORD INTERPRETATION    Dates of Recording: 3/21-10/04/2022  Sensor description:dexcom   Results statistics:   CGM use % of time 93  Average and SD 159/53  Time in range      64  %  % Time Above 180 29  % Time above 250 5  % Time Below target 1   Glycemic patterns summary: BG's trend down overnight , they tend to fluctuate during the day  Hyperglycemic episodes  post supper   Hypoglycemic episodes occurred variable during the day   Overnight periods: trends down     DIABETIC COMPLICATIONS: Microvascular complications:   Denies: CKD , neuropathy, retinopathy  Last Eye Exam: Completed 2023  Macrovascular complications:   Denies: CAD, CVA, PVD   HISTORY:  Past Medical History:  Past Medical History:  Diagnosis Date  Diabetes    Past Surgical History:  Past Surgical History:  Procedure Laterality Date   NOSE SURGERY     Social History:  reports that he has quit smoking. He has never used smokeless tobacco. He reports current alcohol use. He reports current drug use. Drug: Marijuana. Family History: No family history on file.   HOME MEDICATIONS: Allergies as of 10/04/2022   No Known Allergies      Medication List        Accurate as of October 04, 2022  1:10 PM. If you have any questions, ask your nurse or doctor.          Contour Next Test test strip Generic drug: glucose blood TEST FOUR TIMES DAILY AS DIRECTED   Dexcom G6 Receiver Devi 1 each by Does not apply route See admin instructions. Change every 10 days   Dexcom G6 Sensor Misc 1 each by Does not apply route See admin instructions. Change every 10 days   Dexcom G6 Transmitter Misc USE TO MONITOR  GLUCOSE LEVELS AS DIRECTED   glucagon 1 MG injection Inject 1 mg into the muscle once as needed for up to 1 dose (HYPOGLYCEMIA). E11.9   Infusion Set 23" 8MM Misc 1 Device by Does not apply route every 3 (three) days.   insulin aspart 100 UNIT/ML injection Commonly known as: NovoLOG For use in pump, total of 100 units per day   Insulin Infus Pump Reservoir Misc 1 Device by Does not apply route every 3 (three) days.   meloxicam 15 MG tablet Commonly known as: MOBIC Take 1 tablet (15 mg total) by mouth daily as needed.   Microlet Lancets Misc USE 1 FOUR TIMES DAILY         OBJECTIVE:   Vital Signs: BP 122/86   Pulse (!) 102   Ht 6\' 2"  (1.88 m)   Wt 259 lb (117.5 kg)   SpO2 96%   BMI 33.25 kg/m   Wt Readings from Last 3 Encounters:  10/04/22 259 lb (117.5 kg)  08/19/21 262 lb (118.8 kg)  04/18/21 258 lb 3.2 oz (117.1 kg)     Exam: General: Pt appears well and is in NAD  Neck: General: Supple without adenopathy. Thyroid: Thyroid size normal.  No goiter or nodules appreciated.   Lungs: Clear with good BS bilat   Heart: RRR   Abdomen:  soft, nontender  Extremities: No pretibial edema.   Skin: Normal texture and temperature to palpation. No rash noted. No Acanthosis nigricans/skin tags.   Neuro: MS is good with appropriate affect, pt is alert and Ox3    DM foot exam: 10/04/2022  The skin of the feet is intact without sores or ulcerations. The pedal pulses are 2+ on right and 2+ on left. The sensation is intact to a screening 5.07, 10 gram monofilament bilaterally        DATA REVIEWED:  Lab Results  Component Value Date   HGBA1C 6.3 (A) 08/19/2021   HGBA1C 6.7 (A) 04/18/2021   HGBA1C 6.6 (A) 12/01/2020   Lab Results  Component Value Date   MICROALBUR <0.7 05/15/2016   LDLCALC 121 (H) 12/04/2014   CREATININE 0.81 12/04/2014   Lab Results  Component Value Date   MICRALBCREAT 0.7 05/15/2016     Lab Results  Component Value Date   CHOL 212 (H)  12/04/2014   HDL 65.60 12/04/2014   LDLCALC 121 (H) 12/04/2014   TRIG 126.0 12/04/2014   CHOLHDL 3 12/04/2014  ASSESSMENT / PLAN / RECOMMENDATIONS:   1) Type 2 Diabetes Mellitus, optimally controlled, With out complications - Most recent A1c of 6.5%. Goal A1c <7.0%.     -His A1c is optimal but the patient has been noted with variable hypoglycemia this seems to occur mostly following a bolus -In further reviewing his tandem pump download, the patient has been noted with manual boluses, I did encourage the patient to enter his actual carbohydrates into the pump -I also have encouraged him to enter it prior to the meal, sometimes he does bolus after the meal which can result in hypoglycemia -He has been manually bolusing by using insulin to carb ratio of 1:12, I will change that to 1:13 -No changes to the basal rate or the correction scale at this time -He has tried Ozempic and did not like it due to his GI side effects   MEDICATIONS: NovoLog    Pump   Tandem  Settings   Insulin type   Novolog    Basal rate       0000 1.9u/h    0600 1.7   1815 1.8      I:C ratio       0000 1:13                  Sensitivity       0000  45      Goal       0000  110        EDUCATION / INSTRUCTIONS: BG monitoring instructions: Patient is instructed to check his blood sugars 3 times a day. Call [facility] if: BG persistently < 70  I reviewed the Rule of 15 for the treatment of hypoglycemia in detail with the patient. Literature supplied.    2) Diabetic complications:  Eye: Does not have known diabetic retinopathy.  Neuro/ Feet: Does night have known diabetic peripheral neuropathy .  Renal: Patient does not have known baseline CKD. He   is not on an ACEI/ARB at present.    3) Lipids: Patient is *** on a statin.    F/U in 6 months     Signed electronically by: Mack Guise, MD  [facility] Endocrinology  [facility] [address] [place], [place] [address] Phone: [phone] FAX: [phone]   CC: [facility] [place] [address] Phone: [phone]  Fax: [phone]  Return to Endocrinology clinic as below: No future appointments.

## 2022-10-05 LAB — MICROALBUMIN / CREATININE URINE RATIO
Creatinine,U: 187.5 mg/dL
Microalb Creat Ratio: 0.6 mg/g (ref 0.0–30.0)
Microalb, Ur: 1 mg/dL (ref 0.0–1.9)

## 2022-10-09 ENCOUNTER — Encounter: Payer: Self-pay | Admitting: Internal Medicine

## 2022-11-01 DIAGNOSIS — Z7189 Other specified counseling: Secondary | ICD-10-CM | POA: Diagnosis not present

## 2022-11-13 DIAGNOSIS — Z794 Long term (current) use of insulin: Secondary | ICD-10-CM | POA: Diagnosis not present

## 2022-11-13 DIAGNOSIS — E109 Type 1 diabetes mellitus without complications: Secondary | ICD-10-CM | POA: Diagnosis not present

## 2022-11-13 DIAGNOSIS — E1065 Type 1 diabetes mellitus with hyperglycemia: Secondary | ICD-10-CM | POA: Diagnosis not present

## 2022-11-15 ENCOUNTER — Telehealth: Payer: Self-pay | Admitting: Internal Medicine

## 2022-11-15 NOTE — Telephone Encounter (Signed)
MEDICATION: Novolog  PHARMACY:  [facility] on [address] in [place]  HAS THE PATIENT CONTACTED THEIR PHARMACY?    IS THIS A 90 DAY SUPPLY : YES  IS PATIENT OUT OF MEDICATION: YES  IF NOT; HOW MUCH IS LEFT:   LAST APPOINTMENT DATE: @4 /09/2022  NEXT APPOINTMENT DATE:@10 /16/2024  DO WE HAVE YOUR PERMISSION TO LEAVE A DETAILED MESSAGE?: yes  OTHER COMMENTS:    **Let patient know to contact pharmacy at the end of the day to make sure medication is ready. **  ** Please notify patient to allow 48-72 hours to process**  **Encourage patient to contact the pharmacy for refills or they can request refills through [facility] on [address] in [place]

## 2022-11-15 NOTE — Telephone Encounter (Signed)
Spoke with pharmacy and they script from 10/04/22 and will get ready for patient

## 2022-12-02 DIAGNOSIS — Z7189 Other specified counseling: Secondary | ICD-10-CM | POA: Diagnosis not present

## 2023-01-01 DIAGNOSIS — Z7189 Other specified counseling: Secondary | ICD-10-CM | POA: Diagnosis not present

## 2023-02-01 DIAGNOSIS — Z7189 Other specified counseling: Secondary | ICD-10-CM | POA: Diagnosis not present

## 2023-03-04 DIAGNOSIS — Z7189 Other specified counseling: Secondary | ICD-10-CM | POA: Diagnosis not present

## 2023-03-30 DIAGNOSIS — Z794 Long term (current) use of insulin: Secondary | ICD-10-CM | POA: Diagnosis not present

## 2023-03-30 DIAGNOSIS — E109 Type 1 diabetes mellitus without complications: Secondary | ICD-10-CM | POA: Diagnosis not present

## 2023-04-03 DIAGNOSIS — Z7189 Other specified counseling: Secondary | ICD-10-CM | POA: Diagnosis not present

## 2023-04-18 ENCOUNTER — Ambulatory Visit: Payer: BC Managed Care – PPO | Admitting: Internal Medicine

## 2023-04-18 ENCOUNTER — Encounter: Payer: Self-pay | Admitting: Internal Medicine

## 2023-04-18 VITALS — BP 130/86 | HR 107 | Ht 74.0 in | Wt 260.8 lb

## 2023-04-18 DIAGNOSIS — E109 Type 1 diabetes mellitus without complications: Secondary | ICD-10-CM | POA: Diagnosis not present

## 2023-04-18 LAB — POCT GLYCOSYLATED HEMOGLOBIN (HGB A1C): Hemoglobin A1C: 6.4 % — AB (ref 4.0–5.6)

## 2023-04-18 NOTE — Progress Notes (Unsigned)
Name: Trevor Dalton  Age/ Sex: 37 y.o., male   MRN/ DOB: [id number], 1985-07-28     [facility]P: [facility]   Reason for Endocrinology Evaluation: Type 1 Diabetes Mellitus  Initial Endocrine Consultative Visit: 09/10/2013    PATIENT IDENTIFIER: Mr. Trevor Dalton is a 37 y.o. male with a past medical history of DM. The patient has followed with Endocrinology clinic since 09/10/2013 for consultative assistance with management of his diabetes.  DIABETIC HISTORY:  [name] was diagnosed with DM 1992 at age 24, he has been on insulin pump since 2022 ( tandem) , he was unable to tolerate Ozempic . His hemoglobin A1c has ranged from 6.3% in 2023, peaking at 9.0% in 2016.   He was followed by Dr. Everardo All 2015 until 08/2021    SUBJECTIVE:   During the last visit (10/04/2022): Saw Dr. Everardo All   Today (04/18/2023): [name] is here for a follow up on diabetes management.  He checks his blood sugars multiple times daily. The patient has  had hypoglycemic episodes since the last clinic visit, which typically occur during the day. The patient is  symptomatic with these episodes   Denies nausea, vomiting Denies constipation  or diarrhea     HOME DIABETES REGIMEN:  Novolog    Statin: no ACE-I/ARB: no Prior Diabetic Education: yes   This patient with type 1 diabetes is treated with Tandem  (insulin pump). During the visit the pump basal and bolus doses were reviewed including carb/insulin rations and supplemental doses. The clinical list was updated. The glucose meter download was reviewed in detail to determine if the current pump settings are providing the best glycemic control without excessive hypoglycemia.    Pump and meter download:     Pump   Tandem  Settings   Insulin type   Novolog    Basal rate       0000 1.9 u/h    0600 1.6   1815 1.8      I:C ratio       0000 1:13                  Sensitivity       0000  45      Goal       0000  110          Type & Model of Pump: Tandem  Insulin Type: Currently using Novlog .  Body mass index is 33.48 kg/m.  PUMP STATISTICS: Average BG: 152 Average Daily Carbs (g): 104 Average Total Daily Insulin: 74.78 Average Daily Basal: 38.99 (52 %) Average Daily Bolus: 35.79 (48 %)    CONTINUOUS GLUCOSE MONITORING RECORD INTERPRETATION    Dates of Recording: 9/19-10/16/2024  Sensor description:dexcom   Results statistics:   CGM use % of time 91  Average and SD 152/49  Time in range   73 %  % Time Above 180 21  % Time above 250 4.5  % Time Below target 1.8   Glycemic patterns summary: BGs are optimal  Hyperglycemic episodes postprandial  Hypoglycemic episodes occurred variable during the day   Overnight periods: Variable    DIABETIC COMPLICATIONS: Microvascular complications:   Denies: CKD , neuropathy, retinopathy  Last Eye Exam: Completed 2023  Macrovascular complications:   Denies: CAD, CVA, PVD   HISTORY:  Past Medical History:  Past Medical History:  Diagnosis Date   Diabetes (HCC)    Past Surgical History:  Past Surgical History:  Procedure Laterality Date  NOSE SURGERY     Social History:  reports that he has quit smoking. He has never used smokeless tobacco. He reports current alcohol use. He reports current drug use. Drug: Marijuana. Family History: No family history on file.   HOME MEDICATIONS: Allergies as of 04/18/2023   No Known Allergies      Medication List        Accurate as of April 18, 2023  3:46 PM. If you have any questions, ask your nurse or doctor.          Contour Next Test test strip Generic drug: glucose blood TEST FOUR TIMES DAILY AS DIRECTED   Dexcom G7 Sensor Misc 1 Device by Does not apply route as directed.   glucagon 1 MG injection Inject 1 mg into the muscle once as needed for up to 1 dose (HYPOGLYCEMIA). E11.9   Infusion Set 23" Misc 1 Device by Does not apply route every 3 (three) days.    insulin aspart 100 UNIT/ML injection Commonly known as: NovoLOG For use in pump, total of 100 units per day   Insulin Infus Pump Reservoir Misc 1 Device by Does not apply route every 3 (three) days.   meloxicam 15 MG tablet Commonly known as: MOBIC Take 1 tablet (15 mg total) by mouth daily as needed.   Microlet Lancets Misc USE 1 FOUR TIMES DAILY         OBJECTIVE:   Vital Signs: BP 130/86   Pulse (!) 107   Ht 6\' 2"  (1.88 m)   Wt 260 lb 12.8 oz (118.3 kg)   SpO2 98%   BMI 33.48 kg/m   Wt Readings from Last 3 Encounters:  04/18/23 260 lb 12.8 oz (118.3 kg)  10/04/22 259 lb (117.5 kg)  08/19/21 262 lb (118.8 kg)     Exam: General: Pt appears well and is in NAD  Neck: General: Supple without adenopathy. Thyroid: Thyroid size normal.  No goiter or nodules appreciated.   Lungs: Clear with good BS bilat   Heart: RRR   Abdomen:  soft, nontender  Extremities: No pretibial edema.   Neuro: MS is good with appropriate affect, pt is alert and Ox3    DM foot exam: 10/04/2022  The skin of the feet is intact without sores or ulcerations. The pedal pulses are 2+ on right and 2+ on left. The sensation is intact to a screening 5.07, 10 gram monofilament bilaterally        DATA REVIEWED:  Lab Results  Component Value Date   HGBA1C 6.4 (A) 04/18/2023   HGBA1C 6.5 (A) 10/04/2022   HGBA1C 6.3 (A) 08/19/2021    Latest Reference Range & Units 10/04/22 13:41  Sodium 135 - 145 mEq/L 137  Potassium 3.5 - 5.1 mEq/L 3.7  Chloride 96 - 112 mEq/L 102  CO2 19 - 32 mEq/L 29  Glucose 70 - 99 mg/dL 914 (H)  BUN 6 - 23 mg/dL 12  Creatinine 7.82 - 9.56 mg/dL 2.13  Calcium 8.4 - 08.6 mg/dL 9.5  GFR >57.84 mL/min 113.06  Total CHOL/HDL Ratio  4  Cholesterol 0 - 200 mg/dL 696  HDL Cholesterol >29.52 mg/dL 84.13  LDL (calc) 0 - 99 mg/dL 244 (H)  NonHDL  010.27  Triglycerides 0.0 - 149.0 mg/dL 25.3  VLDL 0.0 - 66.4 mg/dL 40.3  (H): Data is abnormally high  ASSESSMENT / PLAN  / RECOMMENDATIONS:   1) Type 2 Diabetes Mellitus, optimally controlled, With out complications - Most recent A1c of 6.4%.  Goal A1c <7.0%.     -His A1c is optimal  -He is doing better as far as counting carbohydrates and entering them into  the pump -He has tried Ozempic and did not like it due to his GI side effects -Patient has been noted with hyperglycemia in the morning, he is having to manually bolus, I will adjust his basal rate as below -I will also decrease his basal rate throughout the day to prevent hypoglycemia -I will also change his correction factor from 45 to 40  MEDICATIONS: NovoLog         Pump   Tandem  Settings   Insulin type   Novolog    Basal rate       0000 1.9u/h    0300 2.0   0800 1.6   1815 1.8      I:C ratio       0000 1:13                  Sensitivity       0000  40      Goal       0000  110          EDUCATION / INSTRUCTIONS: BG monitoring instructions: Patient is instructed to check his blood sugars 3 times a day. Call  Endocrinology clinic if: BG persistently < 70  I reviewed the Rule of 15 for the treatment of hypoglycemia in detail with the patient. Literature supplied.    2) Diabetic complications:  Eye: Does not have known diabetic retinopathy.  Neuro/ Feet: Does night have known diabetic peripheral neuropathy .  Renal: Patient does not have known baseline CKD. He   is not on an ACEI/ARB at present.    3) Dyslipidemia:  -LDL above goal, will focus on lifestyle changes with low-fat diet -We will continue to monitor   F/U in 6 months     I spent 25 minutes preparing to see the patient by review of recent labs, imaging and procedures, obtaining and reviewing separately obtained history, communicating with the patient, ordering medications, tests or procedures, and documenting clinical information in the EHR including the differential Dx, treatment, and any further evaluation and other management    Signed  electronically by: Lyndle Herrlich, MD  [facility] Endocrinology  [facility] [address] [place] [place], [place] [address] Phone: [phone] FAX: [phone]   CC: [facility] [address] [place] [address] Phone: [phone]  Fax: [phone]  Return to Endocrinology clinic as below: No future appointments.

## 2023-04-18 NOTE — Patient Instructions (Signed)

## 2023-04-19 ENCOUNTER — Encounter: Payer: Self-pay | Admitting: Internal Medicine

## 2023-04-19 MED ORDER — INSULIN ASPART 100 UNIT/ML IJ SOLN: INTRAMUSCULAR | 3 refills | Status: DC

## 2023-04-20 DIAGNOSIS — Z794 Long term (current) use of insulin: Secondary | ICD-10-CM | POA: Diagnosis not present

## 2023-04-20 DIAGNOSIS — E109 Type 1 diabetes mellitus without complications: Secondary | ICD-10-CM | POA: Diagnosis not present

## 2023-05-04 DIAGNOSIS — Z7189 Other specified counseling: Secondary | ICD-10-CM | POA: Diagnosis not present

## 2023-06-03 DIAGNOSIS — Z7189 Other specified counseling: Secondary | ICD-10-CM | POA: Diagnosis not present

## 2023-07-04 DIAGNOSIS — Z7189 Other specified counseling: Secondary | ICD-10-CM | POA: Diagnosis not present

## 2023-07-27 DIAGNOSIS — R21 Rash and other nonspecific skin eruption: Secondary | ICD-10-CM | POA: Diagnosis not present

## 2023-07-27 DIAGNOSIS — L308 Other specified dermatitis: Secondary | ICD-10-CM | POA: Diagnosis not present

## 2023-07-27 DIAGNOSIS — L400 Psoriasis vulgaris: Secondary | ICD-10-CM | POA: Diagnosis not present

## 2023-08-04 DIAGNOSIS — Z7189 Other specified counseling: Secondary | ICD-10-CM | POA: Diagnosis not present

## 2023-08-09 ENCOUNTER — Other Ambulatory Visit (HOSPITAL_COMMUNITY): Payer: Self-pay

## 2023-08-09 ENCOUNTER — Telehealth: Payer: Self-pay

## 2023-08-09 NOTE — Telephone Encounter (Signed)
Omnipod needs PA

## 2023-08-09 NOTE — Telephone Encounter (Signed)
 Pharmacy Patient Advocate Encounter   Received notification from Pt Calls Messages that prior authorization for Omnipod is required/requested.   Insurance verification completed.   The patient is insured through [facility] .   Per test claim: PA required; PA started via CoverMyMeds. KEY [id number] . Waiting for clinical questions to populate.

## 2023-08-15 NOTE — Telephone Encounter (Signed)
Any update on PA?

## 2023-08-16 ENCOUNTER — Telehealth: Payer: Self-pay | Admitting: Pharmacy Technician

## 2023-08-16 ENCOUNTER — Other Ambulatory Visit (HOSPITAL_COMMUNITY): Payer: Self-pay

## 2023-08-16 DIAGNOSIS — Z79899 Other long term (current) drug therapy: Secondary | ICD-10-CM | POA: Diagnosis not present

## 2023-08-16 DIAGNOSIS — Z111 Encounter for screening for respiratory tuberculosis: Secondary | ICD-10-CM | POA: Diagnosis not present

## 2023-08-16 DIAGNOSIS — L2089 Other atopic dermatitis: Secondary | ICD-10-CM | POA: Diagnosis not present

## 2023-08-16 NOTE — Telephone Encounter (Signed)
Called [employer] & they sent over a fax form from the ins co. It has been filled out and faxed back, along with office notes.

## 2023-08-16 NOTE — Telephone Encounter (Signed)
It was sent to [facility]

## 2023-08-16 NOTE — Telephone Encounter (Signed)
PA request did not populate clinical questions. It says a duplicate pending PA exists. I don't see any other notes of one being requested, nor do I see anything else in his account about the omnipod, if he's switching from the Tandem or what. I don't even see a prescription. Was it sent to a medical supplier or something like that? I need more information before I call the insurance. If it does need to go through the pharmacy benefit, I need a prescription or documentation of a quantity for a day supply, etc.

## 2023-08-16 NOTE — Telephone Encounter (Signed)
Pharmacy Patient Advocate Encounter   Received notification from Pt Calls Messages that prior authorization for Omnipod is required/requested.   Insurance verification completed.   The patient is insured through [facility] .   Per test claim: PA required; PA submitted to above mentioned insurance via Fax Key/confirmation #/EOC [id number] Status is pending  Faxed to [place] [phone]

## 2023-08-16 NOTE — Telephone Encounter (Signed)
Change pod every 2-3 days  Qty 30 with 3 refills  Patient request to change to this pump

## 2023-08-16 NOTE — Telephone Encounter (Signed)
Thanks. I'll call & see what I can find out.

## 2023-08-16 NOTE — Telephone Encounter (Signed)
I'll have to call to be able to complete the PA they started. They don't do the PAs. They usually will send stuff to [place], but I'm not finding anything in our faxes. I need the directions, quantity, day supply, etc that was ordered and why they are switching from the Tandem. I don't know what clinical questions they will ask, but I need something to go on, since there are no notes about it in his chart.

## 2023-08-17 DIAGNOSIS — Z79899 Other long term (current) drug therapy: Secondary | ICD-10-CM | POA: Diagnosis not present

## 2023-08-20 ENCOUNTER — Other Ambulatory Visit (HOSPITAL_COMMUNITY): Payer: Self-pay

## 2023-08-20 NOTE — Telephone Encounter (Signed)
 Pharmacy Patient Advocate Encounter  Received notification from [facility] that Prior Authorization for Omnipod has been APPROVED from 08/16/23 to 08/15/24. Ran test claim, Copay is $0 for the kit and $12 for a 3 month supply of pods. This test claim was processed through [facility]- copay amounts may vary at other pharmacies due to pharmacy/plan contracts, or as the patient moves through the different stages of their insurance plan.   PA #/Case ID/Reference #: [id number]

## 2023-09-01 DIAGNOSIS — Z7189 Other specified counseling: Secondary | ICD-10-CM | POA: Diagnosis not present

## 2023-10-02 DIAGNOSIS — Z7189 Other specified counseling: Secondary | ICD-10-CM | POA: Diagnosis not present

## 2023-10-03 ENCOUNTER — Telehealth: Payer: Self-pay | Admitting: Nutrition

## 2023-10-03 NOTE — Telephone Encounter (Signed)
LVM to call me to schedule pump training 

## 2023-10-04 ENCOUNTER — Other Ambulatory Visit: Payer: Self-pay

## 2023-10-04 MED ORDER — DEXCOM G7 SENSOR MISC: 1.0000 | 3 refills | Status: DC

## 2023-10-15 ENCOUNTER — Telehealth: Payer: Self-pay | Admitting: Nutrition

## 2023-10-15 NOTE — Telephone Encounter (Signed)
 LVM to call me again to schedule training or to let me know if he has already started this.

## 2023-10-22 ENCOUNTER — Encounter: Payer: Self-pay | Admitting: Internal Medicine

## 2023-10-22 ENCOUNTER — Ambulatory Visit: Payer: BC Managed Care – PPO | Admitting: Internal Medicine

## 2023-10-22 VITALS — BP 147/82 | HR 111 | Ht 74.0 in | Wt 266.8 lb

## 2023-10-22 DIAGNOSIS — E109 Type 1 diabetes mellitus without complications: Secondary | ICD-10-CM | POA: Diagnosis not present

## 2023-10-22 LAB — POCT GLYCOSYLATED HEMOGLOBIN (HGB A1C): Hemoglobin A1C: 6.8 % — AB (ref 4.0–5.6)

## 2023-10-22 NOTE — Patient Instructions (Signed)

## 2023-10-22 NOTE — Progress Notes (Unsigned)
 Name: Trevor Dalton  Age/ Sex: 38 y.o., male   MRN/ DOB: [id number], [birth year]     PCP: [facility]   Reason for Endocrinology Evaluation: Type 1 Diabetes Mellitus  Initial Endocrine Consultative Visit: 09/10/2013    PATIENT IDENTIFIER: Mr. Trevor Dalton is a 38 y.o. male with a past medical history of DM. The patient has followed with Endocrinology clinic since 09/10/2013 for consultative assistance with management of his diabetes.  DIABETIC HISTORY:  Mr. Trevor Dalton was diagnosed with DM 1992 at age 29, he has been on insulin  pump since 2022 ( tandem) , he was unable to tolerate Ozempic  . His hemoglobin A1c has ranged from 6.3% in 2023, peaking at 9.0% in 2016.   He was followed by Dr. Washington Hacker 2015 until 08/2021   He switched from tandem to the OmniPod due to cost 2025    SUBJECTIVE:   During the last visit (04/18/2023): A1c   Today (10/22/2023): Mr. [name] is here for a follow up on diabetes management.  He checks his blood sugars multiple times daily. The patient has had hypoglycemic episodes since the last clinic visit.    Since his last visit here, he has switched from tandem to the OmniPod, he did the online training but no official face-to-face training  Denies nausea, vomiting Denies constipation  or diarrhea     HOME DIABETES REGIMEN:  Novolog     Statin: no ACE-I/ARB: no Prior Diabetic Education: yes   This patient with type 1 diabetes is treated with Omnipod (insulin  pump). During the visit the pump basal and bolus doses were reviewed including carb/insulin  rations and supplemental doses. The clinical list was updated. The glucose meter download was reviewed in detail to determine if the current pump settings are providing the best glycemic control without excessive hypoglycemia.    Pump and meter download:     Pump   Omnipod  Settings   Insulin  type   Novolog     Basal rate       0000 1.9 u/h    0300 2.0   0800 1.6   1830 1.6           I:C ratio       0000 1:12                  Sensitivity       0000  34      Goal       0000  110       AIT : 2 hrs   Type & Model of Pump: Tandem  Insulin  Type: Currently using Novlog .  Body mass index is 34.26 kg/m.  PUMP STATISTICS: Unable to download  CONTINUOUS GLUCOSE MONITORING RECORD INTERPRETATION    Dates of Recording: 4/8-4/21/2025  Sensor description:dexcom   Results statistics:   CGM use % of time 98  Average and SD 155/51  Time in range 67 %  % Time Above 180 27  % Time above 250 4  % Time Below target 2   Glycemic patterns summary: BGs fluctuate throughout the day and night  Hyperglycemic episodes postprandial  Hypoglycemic episodes occurred variable during the day after bolus   Overnight periods: Variable    DIABETIC COMPLICATIONS: Microvascular complications:   Denies: CKD , neuropathy, retinopathy  Last Eye Exam: Completed 2023  Macrovascular complications:   Denies: CAD, CVA, PVD   HISTORY:  Past Medical History:  Past Medical History:  Diagnosis Date   Diabetes (HCC)  Past Surgical History:  Past Surgical History:  Procedure Laterality Date   NOSE SURGERY     Social History:  reports that he has quit smoking. He has never used smokeless tobacco. He reports current alcohol use. He reports current drug use. Drug: Marijuana. Family History: No family history on file.   HOME MEDICATIONS: Allergies as of 10/22/2023   No Known Allergies      Medication List        Accurate as of October 22, 2023  3:00 PM. If you have any questions, ask your nurse or doctor.          Contour Next Test test strip Generic drug: glucose blood TEST FOUR TIMES DAILY AS DIRECTED   Dexcom G7 Sensor Misc 1 Device by Does not apply route as directed.   glucagon  1 MG injection Inject 1 mg into the muscle once as needed for up to 1 dose (HYPOGLYCEMIA). E11.9   Infusion Set 23" Misc 1 Device by Does not apply route every 3  (three) days.   insulin  aspart 100 UNIT/ML injection Commonly known as: NovoLOG  For use in pump, total of 120 units per day   Insulin  Infus Pump Reservoir Misc 1 Device by Does not apply route every 3 (three) days.   meloxicam  15 MG tablet Commonly known as: MOBIC  Take 1 tablet (15 mg total) by mouth daily as needed.   Microlet Lancets Misc USE 1 FOUR TIMES DAILY         OBJECTIVE:   Vital Signs: BP (!) 147/82   Pulse (!) 111   Ht 6\' 2"  (1.88 m)   Wt 266 lb 12.8 oz (121 kg)   SpO2 93%   BMI 34.26 kg/m   Wt Readings from Last 3 Encounters:  10/22/23 266 lb 12.8 oz (121 kg)  04/18/23 260 lb 12.8 oz (118.3 kg)  10/04/22 259 lb (117.5 kg)     Exam: General: Pt appears well and is in NAD  Lungs: Clear with good BS bilat   Heart: RRR   Abdomen:  soft, nontender  Extremities: No pretibial edema.   Neuro: MS is good with appropriate affect, pt is alert and Ox3    DM foot exam: 10/22/2023  The skin of the feet is intact without sores or ulcerations. The pedal pulses are 2+ on right and 2+ on left. The sensation is intact to a screening 5.07, 10 gram monofilament bilaterally        DATA REVIEWED:  Lab Results  Component Value Date   HGBA1C 6.8 (A) 10/22/2023   HGBA1C 6.4 (A) 04/18/2023   HGBA1C 6.5 (A) 10/04/2022    08/17/2023 BUN 8 Cr. 0.92 GFR 111  ASSESSMENT / PLAN / RECOMMENDATIONS:   1) Type 2 Diabetes Mellitus, optimally controlled, With out complications - Most recent A1c of 6.8%. Goal A1c <7.0%.     -His A1c is optimal  -He is currently using the OmniPod, this has been more cost efficient for him, but he did not receive official training other than the online training, they have not been able to download his pump today, patient has been noted with hypoglycemia followed by post bolus hypoglycemia - I did change his active insulin  time from 2 hours to 4 hours - I also adjusted his insulin  to carb ratio from 12 to 10 - Patient has been noted  with manually bolusing with meals, I did encourage the patient again to enter carbohydrates into the pump rather than insulin  units -He has tried  Ozempic  and did not like it due to his GI side effects MEDICATIONS: NovoLog            Pump   Omnipod  Settings   Insulin  type   Novolog     Basal rate       0000 1.9 u/h    0300 2.0   0800 1.6   1830 1.6          I:C ratio       0000 1:10                  Sensitivity       0000  35      Goal       0000  110          EDUCATION / INSTRUCTIONS: BG monitoring instructions: Patient is instructed to check his blood sugars 3 times a day. Call [place] [facility] if: BG persistently < 70  I reviewed the Rule of 15 for the treatment of hypoglycemia in detail with the patient. Literature supplied.    2) Diabetic complications:  Eye: Does not have known diabetic retinopathy.  Neuro/ Feet: Does night have known diabetic peripheral neuropathy .  Renal: Patient does not have known baseline CKD. He   is not on an ACEI/ARB at present.    3) Dyslipidemia:  -LDL above goal in the past , will focus on lifestyle changes with low-fat diet -We will continue to monitor   F/U in 6 months    I spent 25 minutes preparing to see the patient by review of recent labs, imaging and procedures, obtaining and reviewing separately obtained history, communicating with the patient/family or caregiver, ordering medications, tests or procedures, and documenting clinical information in the EHR including the differential Dx, treatment, and any further evaluation and other management     Signed electronically by: Natale Bail, MD  [facility]  [facility] [address] [place] [place], [place] [address] Phone: [phone] FAX: [phone]   CC: [facility] [address] [place] [address] Phone: [phone]  Fax: [phone]  Return to Endocrinology clinic as  below: No future appointments.

## 2023-10-23 ENCOUNTER — Encounter: Payer: Self-pay | Admitting: Internal Medicine

## 2023-10-23 LAB — MICROALBUMIN / CREATININE URINE RATIO
Creatinine, Urine: 210 mg/dL (ref 20–320)
Microalb Creat Ratio: 2 mg/g{creat} (ref <30)
Microalb, Ur: 0.5 mg/dL

## 2023-11-01 DIAGNOSIS — Z7189 Other specified counseling: Secondary | ICD-10-CM | POA: Diagnosis not present

## 2023-12-02 DIAGNOSIS — Z7189 Other specified counseling: Secondary | ICD-10-CM | POA: Diagnosis not present

## 2023-12-19 ENCOUNTER — Other Ambulatory Visit: Payer: Self-pay | Admitting: Internal Medicine

## 2024-01-01 DIAGNOSIS — Z7189 Other specified counseling: Secondary | ICD-10-CM | POA: Diagnosis not present

## 2024-02-01 DIAGNOSIS — Z7189 Other specified counseling: Secondary | ICD-10-CM | POA: Diagnosis not present

## 2024-03-03 DIAGNOSIS — Z7189 Other specified counseling: Secondary | ICD-10-CM | POA: Diagnosis not present

## 2024-03-25 DIAGNOSIS — L2089 Other atopic dermatitis: Secondary | ICD-10-CM | POA: Diagnosis not present

## 2024-04-02 DIAGNOSIS — Z7189 Other specified counseling: Secondary | ICD-10-CM | POA: Diagnosis not present

## 2024-04-22 ENCOUNTER — Encounter: Payer: Self-pay | Admitting: Internal Medicine

## 2024-04-22 ENCOUNTER — Ambulatory Visit: Admitting: Internal Medicine

## 2024-04-22 VITALS — BP 134/78 | HR 90 | Ht 74.0 in | Wt 258.2 lb

## 2024-04-22 DIAGNOSIS — E109 Type 1 diabetes mellitus without complications: Secondary | ICD-10-CM | POA: Diagnosis not present

## 2024-04-22 LAB — POCT GLYCOSYLATED HEMOGLOBIN (HGB A1C): Hemoglobin A1C: 6.4 % — AB (ref 4.0–5.6)

## 2024-04-22 MED ORDER — OMNIPOD 5 G7 PODS (GEN 5) MISC: 1.0000 | 3 refills | Status: AC

## 2024-04-22 MED ORDER — INSULIN ASPART 100 UNIT/ML IJ SOLN: INTRAMUSCULAR | 3 refills | Status: AC

## 2024-04-22 MED ORDER — DEXCOM G7 SENSOR MISC: 1.0000 | 3 refills | Status: DC

## 2024-04-22 NOTE — Patient Instructions (Signed)

## 2024-04-22 NOTE — Progress Notes (Signed)
 Name: Trevor Dalton  Age/ Sex: 38 y.o., male   MRN/ DOB: [id number], [birth year]     [facility]P: [facility]   Reason for Endocrinology Evaluation: Type 1 Diabetes Mellitus  Initial Endocrine Consultative Visit: 09/10/2013    PATIENT IDENTIFIER: Trevor Dalton is a 38 y.o. male with a past medical history of DM. The patient has followed with Endocrinology clinic since 09/10/2013 for consultative assistance with management of his diabetes.  DIABETIC HISTORY:  Trevor Dalton was diagnosed with DM 1992 at age 56, he has been on insulin  pump since 2022 ( tandem) , he was unable to tolerate Ozempic  . His hemoglobin A1c has ranged from 6.3% in 2023, peaking at 9.0% in 2016.   He was followed by Dr. Kassie 2015 until 08/2021   He switched from tandem to the OmniPod due to cost 2025    SUBJECTIVE:   During the last visit (10/22/2023): A1c 6.8%  Today (04/22/2024): Trevor Dalton is here for a follow up on diabetes management.  He checks his blood sugars multiple times daily. The patient has had hypoglycemic episodes since the last clinic visit. He is symptomatic with these episodes.   No nausea or vomiting  No constipation or diarrhea  He ins on renvoq for eczema which has helped     HOME DIABETES REGIMEN:  Novolog     Statin: no ACE-I/ARB: no Prior Diabetic Education: yes   This patient with type 1 diabetes is treated with Omnipod (insulin  pump). During the visit the pump basal and bolus doses were reviewed including carb/insulin  rations and supplemental doses. The clinical list was updated. The glucose meter download was reviewed in detail to determine if the current pump settings are providing the best glycemic control without excessive hypoglycemia.    Pump and meter download:     Pump   Omnipod  Settings   Insulin  type   Novolog     Basal rate       0000 1.25 u/h    0400 1.6   0800 1.25              I:C ratio       0000 1:10                  Sensitivity        0000  28      Goal       0000  110       AIT : 4 hrs   Type & Model of Pump: Tandem  Insulin  Type: Currently using Novlog .  Body mass index is 33.15 kg/m.  PUMP STATISTICS: Average BG: 151  Average Daily Carbs (g): 213.2  Average Total Daily Insulin : 66.8  Average Daily Basal: 30.2 (45 %) Average Daily Bolus: 36.6 (55 %)    CONTINUOUS GLUCOSE MONITORING RECORD INTERPRETATION    Dates of Recording: 10/8-10/21/2025  Sensor description:dexcom   Results statistics:   CGM use % of time 82.6  Average and SD 151/51  Time in range 74%  % Time Above 180 19  % Time above 250 5  % Time Below target 1   Glycemic patterns summary: BGs fluctuate throughout the day and night  Hyperglycemic episodes postprandial  Hypoglycemic episodes occurred variable during the day after bolus   Overnight periods: Variable    DIABETIC COMPLICATIONS: Microvascular complications:   Denies: CKD , neuropathy, retinopathy  Last Eye Exam: Completed 2023  Macrovascular complications:   Denies: CAD, CVA, PVD   HISTORY:  Past Medical History:  Past Medical History:  Diagnosis Date   Diabetes [facility])    Past Surgical History:  Past Surgical History:  Procedure Laterality Date   NOSE SURGERY     Social History:  reports that he has quit smoking. He has never used smokeless tobacco. He reports current alcohol use. He reports current drug use. Drug: Marijuana. Family History: No family history on file.   HOME MEDICATIONS: Allergies as of 04/22/2024   No Known Allergies      Medication List        Accurate as of April 22, 2024  3:05 PM. If you have any questions, ask your nurse or doctor.          Contour Next Test test strip Generic drug: glucose blood TEST FOUR TIMES DAILY AS DIRECTED   Dexcom G7 Sensor Misc 1 Device by Does not apply route as directed.   glucagon  1 MG injection Inject 1 mg into the muscle once as needed for up to 1 dose (HYPOGLYCEMIA).  E11.9   Infusion Set 23 Misc 1 Device by Does not apply route every 3 (three) days.   Insulin  Infus Pump Reservoir Misc 1 Device by Does not apply route every 3 (three) days.   meloxicam  15 MG tablet Commonly known as: MOBIC  Take 1 tablet (15 mg total) by mouth daily as needed.   Microlet Lancets Misc USE 1 FOUR TIMES DAILY   NovoLOG  100 UNIT/ML injection Generic drug: insulin  aspart For use in pump, total of 120 units per day         OBJECTIVE:   Vital Signs: BP 134/78 (BP Location: Left Arm, Patient Position: Sitting, Cuff Size: Normal)   Pulse 90   Ht 6' 2 (1.88 m)   Wt 258 lb 3.2 oz (117.1 kg)   SpO2 97%   BMI 33.15 kg/m   Wt Readings from Last 3 Encounters:  04/22/24 258 lb 3.2 oz (117.1 kg)  10/22/23 266 lb 12.8 oz (121 kg)  04/18/23 260 lb 12.8 oz (118.3 kg)     Exam: General: Pt appears well and is in NAD  Lungs: Clear with good BS bilat   Heart: RRR   Abdomen:  soft, nontender  Extremities: No pretibial edema.   Neuro: MS is good with appropriate affect, pt is alert and Ox3    DM foot exam: 04/22/2024  The skin of the feet is intact without sores or ulcerations. The pedal pulses are 2+ on right and 2+ on left. The sensation is intact to a screening 5.07, 10 gram monofilament bilaterally     DATA REVIEWED:  Lab Results  Component Value Date   HGBA1C 6.4 (A) 04/22/2024   HGBA1C 6.8 (A) 10/22/2023   HGBA1C 6.4 (A) 04/18/2023    08/17/2023 BUN 8 Cr. 0.92 GFR 111   Latest Reference Range & Units 10/22/23 15:25  Microalb, Ur mg/dL 0.5  MICROALB/CREAT RATIO <30 mg/g creat 2  Creatinine, Urine 20 - 320 mg/dL 789      ASSESSMENT / PLAN / RECOMMENDATIONS:   1) Type 2 Diabetes Mellitus, optimally controlled, With out complications - Most recent A1c of 6.4%. Goal A1c <7.0%.     -A1c remains optimal -He switched from the tandem to the OmniPod in 2025 due to cost -He has self adjusted his basal and sensitivity factor settings -No  changes at this time -He has tried Ozempic  and did not like it due to his GI side effects - No changes   MEDICATIONS:  NovoLog           Pump   Omnipod  Settings   Insulin  type   Novolog     Basal rate       0000 1.25 u/h    0400 1.6   0800 1.25              I:C ratio       0000 1:10                  Sensitivity       0000  28      Goal       0000  110            EDUCATION / INSTRUCTIONS: BG monitoring instructions: Patient is instructed to check his blood sugars 3 times a day. Call [place] [facility] if: BG persistently < 70  I reviewed the Rule of 15 for the treatment of hypoglycemia in detail with the patient. Literature supplied.    2) Diabetic complications:  Eye: Does not have known diabetic retinopathy.  Neuro/ Feet: Does night have known diabetic peripheral neuropathy .  Renal: Patient does not have known baseline CKD. He   is not on an ACEI/ARB at present.    3) Dyslipidemia:  -LDL above goal in the past , will focus on lifestyle changes with low-fat diet -We will continue to monitor   F/U in 6 months       Signed electronically by: Stefano Redgie Butts, MD  [facility]  [facility] [address] [place] [place], [place] [address] Phone: [phone] FAX: [phone]   CC: [facility] [address] [place] [address] Phone: [phone]  Fax: [phone]  Return to Endocrinology clinic as below: No future appointments.

## 2024-04-23 ENCOUNTER — Encounter: Payer: Self-pay | Admitting: Internal Medicine

## 2024-05-03 DIAGNOSIS — Z7189 Other specified counseling: Secondary | ICD-10-CM | POA: Diagnosis not present

## 2024-05-14 DIAGNOSIS — L508 Other urticaria: Secondary | ICD-10-CM | POA: Diagnosis not present

## 2024-06-13 DIAGNOSIS — M5416 Radiculopathy, lumbar region: Secondary | ICD-10-CM | POA: Diagnosis not present

## 2024-06-23 ENCOUNTER — Telehealth: Payer: Self-pay | Admitting: Internal Medicine

## 2024-06-23 ENCOUNTER — Other Ambulatory Visit: Payer: Self-pay

## 2024-06-23 DIAGNOSIS — E109 Type 1 diabetes mellitus without complications: Secondary | ICD-10-CM

## 2024-06-23 DIAGNOSIS — M5416 Radiculopathy, lumbar region: Secondary | ICD-10-CM | POA: Diagnosis not present

## 2024-06-23 MED ORDER — DEXCOM G7 SENSOR MISC: 1.0000 | 3 refills | Status: DC

## 2024-06-23 NOTE — Telephone Encounter (Signed)
 MEDICATION: Continuous Glucose Sensor (DEXCOM G7 SENSOR) MISC   PHARMACY:  [id number] [address] [place], [place] [address] [phone]  HAS THE PATIENT CONTACTED THEIR PHARMACY?  Yes  LAST REFILL:  @@LASTREFILL @  IS THIS A 90 DAY SUPPLY : Yes  IS PATIENT OUT OF MEDICATION: No  IF NOT; HOW MUCH IS LEFT: 1  LAST APPOINTMENT DATE: @10 /21/2025  NEXT APPOINTMENT DATE:@4 /21/2026  DO WE HAVE YOUR PERMISSION TO LEAVE A DETAILED MESSAGE?:Yes  OTHER COMMENTS: Patient would like extra sent in due to some of the sensors not working.   **Let patient know to contact pharmacy at the end of the day to make sure medication is ready. **  ** Please notify patient to allow 48-72 hours to process**  **Encourage patient to contact the pharmacy for refills or they can request refills through [facility]**

## 2024-06-24 ENCOUNTER — Other Ambulatory Visit: Payer: Self-pay

## 2024-06-24 DIAGNOSIS — E109 Type 1 diabetes mellitus without complications: Secondary | ICD-10-CM

## 2024-06-24 DIAGNOSIS — L2089 Other atopic dermatitis: Secondary | ICD-10-CM | POA: Diagnosis not present

## 2024-06-24 MED ORDER — DEXCOM G7 SENSOR MISC: 1.0000 | 3 refills | Status: AC

## 2024-06-24 NOTE — Telephone Encounter (Signed)
 Patient called to clarify request needed from message sent.

## 2024-06-30 DIAGNOSIS — M5416 Radiculopathy, lumbar region: Secondary | ICD-10-CM | POA: Diagnosis not present

## 2024-10-21 ENCOUNTER — Ambulatory Visit: Admitting: Internal Medicine
